# Patient Record
Sex: Female | Born: 1985 | Race: White | Hispanic: No | Marital: Married | Smoking: Never smoker
Health system: Southern US, Community
[De-identification: ages and names within clinical notes are randomized; demographics above are authoritative.]

## PROBLEM LIST (undated history)

## (undated) DIAGNOSIS — J45909 Unspecified asthma, uncomplicated: Secondary | ICD-10-CM

## (undated) HISTORY — PX: NO PAST SURGERIES: SHX2092

## (undated) HISTORY — PX: BASAL CELL CARCINOMA EXCISION: SHX1214

## (undated) HISTORY — PX: WISDOM TOOTH EXTRACTION: SHX21

---
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
## 2009-11-24 ENCOUNTER — Encounter: Admission: RE | Admit: 2009-11-24 | Discharge: 2009-11-24 | Payer: Self-pay | Admitting: Obstetrics and Gynecology

## 2010-06-18 ENCOUNTER — Encounter: Admission: RE | Admit: 2010-06-18 | Discharge: 2010-06-18 | Payer: Self-pay | Admitting: Obstetrics and Gynecology

## 2014-05-29 LAB — OB RESULTS CONSOLE RPR: RPR: NONREACTIVE

## 2014-05-29 LAB — OB RESULTS CONSOLE HEPATITIS B SURFACE ANTIGEN: Hepatitis B Surface Ag: NEGATIVE

## 2014-05-29 LAB — OB RESULTS CONSOLE ABO/RH: RH TYPE: POSITIVE

## 2014-05-29 LAB — OB RESULTS CONSOLE RUBELLA ANTIBODY, IGM: Rubella: IMMUNE

## 2014-05-29 LAB — OB RESULTS CONSOLE ANTIBODY SCREEN: Antibody Screen: NEGATIVE

## 2014-05-29 LAB — OB RESULTS CONSOLE HIV ANTIBODY (ROUTINE TESTING): HIV: NONREACTIVE

## 2014-10-24 NOTE — L&D Delivery Note (Signed)
Operative Delivery Note At 9:19 PM a viable female was delivered via Vaginal, Vacuum Engineer, manufacturing systems).  Presentation: vertex; Position: Left,, Occiput,, Posterior;  Verbal consent: obtained from patient.  Risks and benefits discussed in detail.  Risks include, but are not limited to the risks of anesthesia, bleeding, infection, damage to maternal tissues, fetal cephalhematoma.  There is also the risk of inability to effect vaginal delivery of the head, or shoulder dystocia that cannot be resolved by established maneuvers, leading to the need for emergency cesarean section.  Foley catheter placed prior to attempting vacuum extraction.  Leopolds done to assess fetal weight, position.  Pelvis assessed.  Op presentation noted.  Kiwi placed on posterior occiput.  I confirmed that the vacuum was not attached to the cervix or vagina prior to each pull.  The suction was released during pulls but the cup was not removed.  ~ 3 pulls noted.  Deep right lateral vaginal side wall laceration.  Actively bleeding.  Interrupted sutures placed for active bleeding.  Difficult visualization.  Retractors placed to reach the apex. Vagina avulsed from introitus at midline, no active bleeding.  Labial tears after repair was complete but no bleeding noted so they were not repaired. Perineum observed for several minutes to watch for bleeding, none noted. Uterus was firm with Pitocin bolus throughout repair.  Pt was very comfortable.  APGAR: 7, 9; weight 8 lb 3 oz (3715 g).   Placenta status: Intact, Spontaneous.   Cord: 3 vessels with the following complications: None.  Cord pH: n/a  Anesthesia: Epidural  Instruments: Deever retractors Episiotomy: None Lacerations: 3rd degree;Vaginal;Sulcus Suture Repair: Chromic-0.0, 2.0, 3.0, Vicryl 0.0 Est. Blood Loss (mL): 500 Assistant:  Farrel Gordon, CNM  Mom to postpartum.  Baby to Couplet care / Skin to Skin.  Thurnell Lose 12/21/2014, 10:28 PM

## 2014-12-03 LAB — OB RESULTS CONSOLE GBS: GBS: NEGATIVE

## 2014-12-19 ENCOUNTER — Encounter (HOSPITAL_COMMUNITY): Payer: Self-pay | Admitting: *Deleted

## 2014-12-19 ENCOUNTER — Inpatient Hospital Stay (HOSPITAL_COMMUNITY)
Admission: AD | Admit: 2014-12-19 | Discharge: 2014-12-23 | DRG: 775 | Disposition: A | Discharge status: Home / Self Care | Payer: BC Managed Care – PPO | Source: Ambulatory Visit | Attending: Obstetrics and Gynecology | Admitting: Obstetrics and Gynecology

## 2014-12-19 DIAGNOSIS — O4290 Premature rupture of membranes, unspecified as to length of time between rupture and onset of labor, unspecified weeks of gestation: Secondary | ICD-10-CM | POA: Diagnosis present

## 2014-12-19 DIAGNOSIS — Z3A39 39 weeks gestation of pregnancy: Secondary | ICD-10-CM | POA: Diagnosis present

## 2014-12-19 HISTORY — DX: Unspecified asthma, uncomplicated: J45.909

## 2014-12-19 LAB — WET PREP, GENITAL
CLUE CELLS WET PREP: NONE SEEN
TRICH WET PREP: NONE SEEN
Yeast Wet Prep HPF POC: NONE SEEN

## 2014-12-19 LAB — URINE MICROSCOPIC-ADD ON

## 2014-12-19 LAB — URINALYSIS, ROUTINE W REFLEX MICROSCOPIC
Bilirubin Urine: NEGATIVE
Glucose, UA: NEGATIVE mg/dL
Ketones, ur: NEGATIVE mg/dL
Leukocytes, UA: NEGATIVE
Nitrite: NEGATIVE
Protein, ur: NEGATIVE mg/dL
Specific Gravity, Urine: >1.03 — ABNORMAL HIGH (ref 1.005–1.030)
Urobilinogen, UA: 0.2 mg/dL (ref 0.0–1.0)
pH: 6 (ref 5.0–8.0)

## 2014-12-19 LAB — AMNISURE RUPTURE OF MEMBRANE (ROM) NOT AT ARMC: Amnisure ROM: POSITIVE

## 2014-12-19 NOTE — MAU Provider Note (Signed)
Meagan Torres is a 29 y.o. G1P0 at 39.4 weeks present to MAU c/o lof at 1220 today then again at 1600 clear.  She denies pain, vb or ctx w/+FM   History     There are no active problems to display for this patient.   Chief Complaint  Patient presents with  . Rupture of Membranes   HPI  OB History    Gravida Para Term Preterm AB TAB SAB Ectopic Multiple Living   1               Past Medical History  Diagnosis Date  . Asthma     Past Surgical History  Procedure Laterality Date  . Wisdom tooth extraction      History reviewed. No pertinent family history.  History  Substance Use Topics  . Smoking status: Never Smoker   . Smokeless tobacco: Never Used  . Alcohol Use: Yes     Comment: occas before pregnancy    Allergies: No Known Allergies  Prescriptions prior to admission  Medication Sig Dispense Refill Last Dose  . OVER THE COUNTER MEDICATION Primrose oil   12/18/2014 at Unknown time  . Prenatal Vit-Fe Fumarate-FA (PRENATAL MULTIVITAMIN) TABS tablet Take 1 tablet by mouth daily at 12 noon.   12/18/2014 at Unknown time    ROS See HPI above, all other systems are negative  Physical Exam   Blood pressure 131/84, pulse 90, temperature 98.2 F (36.8 C), resp. rate 18, height 5\' 1"  (1.549 m), weight 154 lb 9.6 oz (70.126 kg).  Physical Exam Ext:  WNL ABD: Soft, non tender to palpation, no rebound or guarding SVE: C/T/H   ED Course  Assessment: IUP at  39.4weeks Membranes: questionable FHR: Category 1 CTX:  q5-6   Plan:  amisure wetprep   Caeson Filippi, CNM, MSN 12/19/2014. 11:10 PM

## 2014-12-19 NOTE — MAU Provider Note (Signed)
MAU Addendum Note  Results for orders placed or performed during the hospital encounter of 12/19/14 (from the past 24 hour(s))  Urinalysis, Routine w reflex microscopic     Status: Abnormal   Collection Time: 12/19/14  9:25 PM  Result Value Ref Range   Color, Urine YELLOW YELLOW   APPearance CLEAR CLEAR   Specific Gravity, Urine >1.030 (H) 1.005 - 1.030   pH 6.0 5.0 - 8.0   Glucose, UA NEGATIVE NEGATIVE mg/dL   Hgb urine dipstick SMALL (A) NEGATIVE   Bilirubin Urine NEGATIVE NEGATIVE   Ketones, ur NEGATIVE NEGATIVE mg/dL   Protein, ur NEGATIVE NEGATIVE mg/dL   Urobilinogen, UA 0.2 0.0 - 1.0 mg/dL   Nitrite NEGATIVE NEGATIVE   Leukocytes, UA NEGATIVE NEGATIVE  Urine microscopic-add on     Status: Abnormal   Collection Time: 12/19/14  9:25 PM  Result Value Ref Range   Squamous Epithelial / LPF FEW (A) RARE   WBC, UA 0-2 <3 WBC/hpf   RBC / HPF 7-10 <3 RBC/hpf   Bacteria, UA FEW (A) RARE  Amnisure rupture of membrane (rom)     Status: None   Collection Time: 12/19/14 11:05 PM  Result Value Ref Range   Amnisure ROM POSITIVE   Wet prep, genital     Status: Abnormal   Collection Time: 12/19/14 11:05 PM  Result Value Ref Range   Yeast Wet Prep HPF POC NONE SEEN NONE SEEN   Trich, Wet Prep NONE SEEN NONE SEEN   Clue Cells Wet Prep HPF POC NONE SEEN NONE SEEN   WBC, Wet Prep HPF POC FEW (A) NONE SEEN     Plan: Admit to L&D for active management   Meagan Torres, CNM, MSN 12/19/2014. 11:39 PM

## 2014-12-19 NOTE — H&P (Signed)
Meagan Torres is a 29 y.o. female, G1 P0 at 39.4 weeks.  Pt desires a no intervention waterbirth.  She is softly declining a IV and does not want pitocin unless absolutely necessary.  We reviewed the potential for infection with prolong rupture.  Pt is aware and understands the risk.  We review if pitocin is started with will NOT be able to have her planned water birth and why.  She expressed understanding.  Consulted with Dr Simona Huh and she has agreed with a category 1 strip for no intervention allowing labor to start on it own. Pt has agreed to intermitting monitoring.  Patient Active Problem List   Diagnosis Date Noted  . PROM (premature rupture of membranes) 12/20/2014    Pregnancy Course: Patient entered care at 24.1 weeks. txr from [facility]  [facility] of 12/22/14 was established by LMP.      [place] evaluations:      24.2 weeks - Anatomy:  single. vertex. anterior placenta (placenta edge is 7.3 cm from internal os - normal) Cx  closed. Fluid WNL. Vertical pocket 5.3 cm. Female gender. Ovaries and adenexas are WNL  weeks - FU:     Significant prenatal events:   none   Last evaluation:   39 weeks  Reason for admission:  PROM  Pt States:   Contractions Frequency: none         Contraction severity: n/a         Fetal activity: +FM  OB History    Gravida Para Term Preterm AB TAB SAB Ectopic Multiple Living   1              Past Medical History  Diagnosis Date  . Asthma    Past Surgical History  Procedure Laterality Date  . Wisdom tooth extraction    . No past surgeries     Family History: family history is not on file. Social History:  reports that she has never smoked. She has never used smokeless tobacco. She reports that she drinks alcohol. She reports that she does not use illicit drugs.   Prenatal Transfer Tool  Maternal Diabetes: No Genetic Screening: Normal Maternal Ultrasounds/Referrals: Normal Fetal Ultrasounds or other Referrals:  None Maternal Substance Abuse:   No Significant Maternal Medications:  None Significant Maternal Lab Results: Lab values include: Group B Strep negative   ROS:  See HPI above, all other systems are negative  No Known Allergies  Dilation: Closed Effacement (%): Thick Exam by:: Kanitra Purifoy CNM Blood pressure 145/89, pulse 95, temperature 98.8 F (37.1 C), temperature source Oral, resp. rate 16, height 5\' 1"  (1.549 m), weight 154 lb (69.854 kg).  Maternal Exam:  Uterine Assessment: Contraction frequency is rare.  Abdomen: Gravid, non tender. Fundal height is aga.  Normal external genitalia, vulva, cervix, uterus and adnexa.  No lesions noted on exam.  Pelvis adequate for delivery.  Fetal presentation: Vertex by bedside US  Fetal Exam:  Monitor Surveillance : Continuous Monitoring   Mode: Ultrasound.  NICHD: Category 1 CTXs: Q 3-4 minutes EFW   7 lbs  Physical Exam: Nursing note and vitals reviewed General: alert and cooperative She appears well nourished Psychiatric: Normal mood and affect. Her behavior is normal Head: Normocephalic Eyes: Pupils are equal, round, and reactive to light Neck: Normal range of motion Cardiovascular: RRR without murmur  Respiratory: CTAB. Effort normal  Abd: soft, non-tender, +BS, no rebound, no guarding  Genitourinary: Vagina normal  Neurological: A&Ox3 Skin: Warm and dry  Musculoskeletal: Normal range  of motion  Homan's sign negative bilaterally No evidence of DVTs.  Edema: 1+ edema DTR: 2+ Clonus: None   Prenatal labs: ABO, Rh: --/--/O POS (02/27 0042) Antibody: NEG (02/27 0039) Rubella:    RPR: Nonreactive (08/06 0000)  HBsAg: Negative (08/06 0000)  HIV: Non-reactive (08/06 0000)  GBS: Negative (02/10 0000) Sickle cell/Hgb electrophoresis:  WNL Pap:  Neg 09/03/15 GC:    Chlamydia:  Genetic screenings:  wnl Glucola:  wnl  Assessment:  IUP at 39.5 weeks NICHD: Category 1 Membranes: SROM x 11hrs Bishop Score: 0 GBS negative   Plan:  Admit to  L&D for expectant/active management of labor. Possible augmentation options reviewed including foley bulb, AROM and/or pitocin.  posslibe risk our of a water birth with pitocin  IV pain medication per orders PRN Epidural per patient request Foley cath after patient is comfortable with epidural Anticipate SVD  Labor mgmt as ordered Desires water birth   Okay to ambulate around unit with wireless monitors  Okay to get up and shower without monitoring   May auscultate FHR intermittently,  if expectant management     q 30 min in active labor     q 15 min in transition     q 5 min with pushing.     May ambulate without monitoring.     If no active labor, may do NST q 2 hours.    [place] labs   Attending MD available at all times.  Eleina Jergens, CNM, MSN 12/20/2014, 3:50 AM       All information will be confirmed upon admisson

## 2014-12-19 NOTE — MAU Note (Signed)
Leaked fld at 1220 today. More leaking after 1600. Clear fld. No ctxs that i know of

## 2014-12-20 ENCOUNTER — Encounter (HOSPITAL_COMMUNITY): Payer: Self-pay | Admitting: *Deleted

## 2014-12-20 DIAGNOSIS — Z3403 Encounter for supervision of normal first pregnancy, third trimester: Secondary | ICD-10-CM | POA: Diagnosis present

## 2014-12-20 DIAGNOSIS — O4290 Premature rupture of membranes, unspecified as to length of time between rupture and onset of labor, unspecified weeks of gestation: Secondary | ICD-10-CM | POA: Diagnosis present

## 2014-12-20 DIAGNOSIS — Z3A39 39 weeks gestation of pregnancy: Secondary | ICD-10-CM | POA: Diagnosis present

## 2014-12-20 LAB — CBC
HEMATOCRIT: 37.8 % (ref 36.0–46.0)
HEMOGLOBIN: 13 g/dL (ref 12.0–15.0)
MCH: 31.9 pg (ref 26.0–34.0)
MCHC: 34.4 g/dL (ref 30.0–36.0)
MCV: 92.9 fL (ref 78.0–100.0)
PLATELETS: 250 10*3/uL (ref 150–400)
RBC: 4.07 MIL/uL (ref 3.87–5.11)
RDW: 13.1 % (ref 11.5–15.5)
WBC: 16.4 10*3/uL — AB (ref 4.0–10.5)

## 2014-12-20 LAB — COMPREHENSIVE METABOLIC PANEL
ALBUMIN: 2.9 g/dL — AB (ref 3.5–5.2)
ALK PHOS: 182 U/L — AB (ref 39–117)
ALT: 18 U/L (ref 0–35)
ANION GAP: 6 (ref 5–15)
AST: 23 U/L (ref 0–37)
BILIRUBIN TOTAL: 0.4 mg/dL (ref 0.3–1.2)
BUN: 10 mg/dL (ref 6–23)
CALCIUM: 8.8 mg/dL (ref 8.4–10.5)
CO2: 22 mmol/L (ref 19–32)
CREATININE: 0.56 mg/dL (ref 0.50–1.10)
Chloride: 106 mmol/L (ref 96–112)
GFR calc Af Amer: >90 mL/min (ref >90)
GFR calc non Af Amer: >90 mL/min (ref >90)
Glucose, Bld: 97 mg/dL (ref 70–99)
Potassium: 3.5 mmol/L (ref 3.5–5.1)
Sodium: 134 mmol/L — ABNORMAL LOW (ref 135–145)
TOTAL PROTEIN: 6.1 g/dL (ref 6.0–8.3)

## 2014-12-20 LAB — TYPE AND SCREEN
ABO/RH(D): O POS
Antibody Screen: NEGATIVE

## 2014-12-20 LAB — LACTATE DEHYDROGENASE: LDH: 140 U/L (ref 94–250)

## 2014-12-20 LAB — URIC ACID: Uric Acid, Serum: 3.7 mg/dL (ref 2.4–7.0)

## 2014-12-20 LAB — PROTEIN / CREATININE RATIO, URINE
CREATININE, URINE: 124 mg/dL
PROTEIN CREATININE RATIO: 0.09 (ref 0.00–0.15)
Total Protein, Urine: 11 mg/dL

## 2014-12-20 LAB — ABO/RH: ABO/RH(D): O POS

## 2014-12-20 MED ORDER — SODIUM CHLORIDE 0.9 % IV SOLN: 250.0000 mL | INTRAVENOUS | Status: DC | PRN

## 2014-12-20 MED ORDER — OXYTOCIN 40 UNITS IN LACTATED RINGERS INFUSION - SIMPLE MED
62.5000 mL/h | INTRAVENOUS | Status: DC
  Filled 2014-12-20: qty 1000

## 2014-12-20 MED ORDER — CITRIC ACID-SODIUM CITRATE 334-500 MG/5ML PO SOLN: 30.0000 mL | ORAL | Status: DC | PRN

## 2014-12-20 MED ORDER — ACETAMINOPHEN 325 MG PO TABS: 650.0000 mg | ORAL_TABLET | ORAL | Status: DC | PRN

## 2014-12-20 MED ORDER — LIDOCAINE HCL (PF) 1 % IJ SOLN
30.0000 mL | INTRAMUSCULAR | Status: DC | PRN
  Filled 2014-12-20: qty 30

## 2014-12-20 MED ORDER — ONDANSETRON HCL 4 MG/2ML IJ SOLN: 4.0000 mg | Freq: Four times a day (QID) | INTRAMUSCULAR | Status: DC | PRN

## 2014-12-20 MED ORDER — LACTATED RINGERS IV SOLN
500.0000 mL | INTRAVENOUS | Status: DC | PRN
  Administered 2014-12-21: 500 mL via INTRAVENOUS

## 2014-12-20 MED ORDER — OXYTOCIN BOLUS FROM INFUSION
500.0000 mL | INTRAVENOUS | Status: DC
  Administered 2014-12-21: 500 mL via INTRAVENOUS

## 2014-12-20 MED ORDER — SODIUM CHLORIDE 0.9 % IJ SOLN: 3.0000 mL | INTRAMUSCULAR | Status: DC | PRN

## 2014-12-20 MED ORDER — OXYCODONE-ACETAMINOPHEN 5-325 MG PO TABS: 2.0000 | ORAL_TABLET | ORAL | Status: DC | PRN

## 2014-12-20 MED ORDER — MISOPROSTOL 200 MCG PO TABS: 50.0000 ug | ORAL_TABLET | ORAL | Status: DC | PRN

## 2014-12-20 MED ORDER — LACTATED RINGERS IV SOLN
INTRAVENOUS | Status: DC
  Administered 2014-12-21 (×4): via INTRAVENOUS

## 2014-12-20 MED ORDER — SODIUM CHLORIDE 0.9 % IJ SOLN
3.0000 mL | Freq: Two times a day (BID) | INTRAMUSCULAR | Status: DC
Start: 2014-12-20 — End: 2014-12-22

## 2014-12-20 MED ORDER — TERBUTALINE SULFATE 1 MG/ML IJ SOLN: 0.2500 mg | Freq: Once | INTRAMUSCULAR | Status: AC | PRN

## 2014-12-20 MED ORDER — OXYTOCIN 40 UNITS IN LACTATED RINGERS INFUSION - SIMPLE MED: 1.0000 m[IU]/min | INTRAVENOUS | Status: DC

## 2014-12-20 NOTE — Progress Notes (Signed)
Pt had intended to discuss with midwife starting pitocin at 1530. She is now feeling the contractions are stronger and more regular, so desires to wait. Voices understanding of risks of waiting with prolonged rupture of membranes.

## 2014-12-20 NOTE — Progress Notes (Signed)
Colena Ketterman MRN: [id number]  Subjective: -Patient reports increase in contraction intensity.  Reports onset of back and hip pain.  Reports good fetal movement, some vaginal bleeding, and continuous LOF.    Objective: BP 125/83 mmHg  Pulse 94  Temp(Src) 97.8 F (36.6 C) (Axillary)  Resp 20  Ht 5\' 1"  (1.549 m)  Wt 154 lb (69.854 kg)  BMI 29.11 kg/m2     FHT: 135 bpm, Mod Var, -Decels, +Accels UC: Q5-71min, palpates mild to moderate   SVE: Deferred Membranes:SROM x 31 hours Pitocin:None  Assessment:  IUP at 39.5 weeks Cat I FT  Prolonged ROM  Plan: -Expectant Management -Continue other mgmt as ordered  Felise Georgia LYNN,MSN, CNM 12/20/2014, 6:57 PM

## 2014-12-20 NOTE — Plan of Care (Signed)
Problem: Consults Goal: Birthing Suites Patient Information Press F2 to bring up selections list Outcome: Completed/Met Date Met:  12/20/14  Pt 37-[redacted] weeks EGA

## 2014-12-20 NOTE — Progress Notes (Signed)
Meagan Torres MRN: [id number]  Subjective: -Patient with husband and family at bedside.  S/P ambulation.  Reports contractions are intensifying, but that they remain infrequent.    Objective: BP 147/86 mmHg  Pulse 99  Temp(Src) 98.5 F (36.9 C) (Oral)  Resp 18  Ht 5\' 1"  (1.549 m)  Wt 154 lb (69.854 kg)  BMI 29.11 kg/m2     FHT: 155 bpm, Mod Var, -Decels, +Accels UC: Occasional graphed   SVE:   Dilation: 2 Effacement (%): 70 Station: -3 Exam by:: Avram Danielson Membranes: SROM x 24 hours Pitocin: None  Assessment:  IUP at 39.5 weeks Cat I FT  Prolonged ROM GBS Negative  Plan: -Discussed options for labor augmentation to include PO cytotec or IV pitocin -Patient informed of need to have IV access in each situation -Patient appears unsure of next step regarding POC and requests time to discuss with husband and family -Dr. Florene Route updated on patient status  -Continue other mgmt as ordered  [facility], Davyn Morandi LYNN,MSN, CNM 12/20/2014, 11:57 AM   Addendum 1245: Dr. Florene Route and I in room to discuss POC with patient and husband R/B of augmentation discussed, questions and concerns addressed Patient initially opting for pitocin augmentation, but then admits that she does not desire any interventions Dr. Florene Route discussed risks and benefits of no augmentation including infection, fetal distress, and prolonged labor Patient understands that fetal distress and/or infection is a contradiction to waterbirth Patient opts to continue expectant management with understanding that this is against recommendation of provider and attending physician Will continue to monitor as appropriate  Ena Demary, [place] MSN, CNM 1:41 PM

## 2014-12-20 NOTE — Progress Notes (Signed)
Labor Progress  Subjective: sleeping  Objective: BP 110/65 mmHg  Pulse 93  Temp(Src) 98 F (36.7 C) (Oral)  Resp 16  Ht 5\' 1"  (1.549 m)  Wt 154 lb (69.854 kg)  BMI 29.11 kg/m2     FHT: 140 moderate variability, + accel, no decel CTX:  irregular, every 3-8 minutes Uterus gravid, soft non tender SVE:  Dilation: 2.5 Effacement (%): 50 Station: -3 Exam by:: [name] cnm   Assessment:  IUP at 39.5 weeks NICHD: Category 1 Membranes:  SROM x 18hrs, no s/s of infection Labor progress: Inadquate labor GBS: negative   Plan: Continue labor plan intermittent monitoring Rest/Ambulate Frequent position changes to facilitate fetal rotation and descent.      Meagan Torres, CNM, MSN 12/20/2014. 6:28 AM

## 2014-12-20 NOTE — Progress Notes (Signed)
Meagan Torres MRN: [id number]  Subjective: -Care assumed of 29y.o. G1P0 at 39.5wks who presented for SROM.  Patient ambulating in hall.  In room to discuss POC as Prolonged ROM will be deemed at 1200.  Patient reports good fetal movement, no vaginal bleeding, continuous leaking of fluid, and contractions q7-63minutes.  Patient unsure of POC for augmentation processes and continues to decline IV access.  Objective: BP 139/93 mmHg  Pulse 98  Temp(Src) 98 F (36.7 C) (Oral)  Resp 16  Ht 5\' 1"  (1.549 m)  Wt 154 lb (69.854 kg)  BMI 29.11 kg/m2     FHT: 145 by doppler at 0745 UC:   None palpated SVE:  Deferred Membranes:SROM at 12pm on 2/26 Pitocin: None  Assessment:  IUP at 39.5wks SROM >18hrs Afebrile GBS Negative Desires Low Intervention Labor and Delivery  Plan: -Discussed pitocin augmentation including contradiction to waterbirth -Discussed oral cytotec dosing, but need for IV access with this method -Patient and husband express optimism regarding progression without pharmacologic interventions -Requests membrane stripping, discussed SROM and how membranes no longer intact -Discussed risk of infection r/t prolonged ROM -Dr. Florene Route updated on patient status, will be in to speak with patient -Continue other mgmt as ordered  [facility], Abigayle Wilinski LYNN,MSN, CNM 12/20/2014, 9:05 AM

## 2014-12-20 NOTE — Progress Notes (Signed)
Meagan Torres MRN: [id number]  Subjective: -Patient reports increase in contraction intensity.  Starting to have shakes, requests vaginal exam.   Objective: BP 137/91 mmHg  Pulse 87  Temp(Src) 98.2 F (36.8 C) (Oral)  Resp 18  Ht 5\' 1"  (1.549 m)  Wt 154 lb (69.854 kg)  BMI 29.11 kg/m2     FHT: 145 bpm, Mod Var, -Decels, +Accels UC:  Q3-60min, palpates mild to moderae  SVE:   Dilation: 2.5 Effacement (%): 90 Station: -2 Exam by:: Meagan Torres cnm Membranes: SROM x 34hours Pitocin: None  Assessment:  IUP at 39.5wks Cat I FT  Prolonged ROM Afebrile Early Labor  Plan: -Patient desires expectant management -Dr. Florene Route updated on patient status -Continue other mgmt as ordered  [facility], Meagan Morua LYNN,MSN, CNM 12/20/2014, 10:19 PM

## 2014-12-20 NOTE — Progress Notes (Signed)
In to introduce myself to patient. Updated that pt went from closed on admission to 2-3 overnight.  Afebrile, contracting but not feeling discomfort. Pt refused saline lock. I asked pt if she understood the risk of not having an IV.  Pt stated it was needed in the event of an emergency. I explained to pt that emergent c-section could be delayed while attempting to IV acces.  Also in the event of hemorrhage, medications would be needed to stop bleeding.  If delayed, hemorrhage could occur requiring a blood transfusion.  Pt verbalized understanding but inquired as to if she could change her mind. FOB and sister-in-law present.

## 2014-12-20 NOTE — Progress Notes (Signed)
In to discuss plan.  Pitocin has been recommended due to protracted labor, prolonged ROM several times by CNM and refused.  Initially it was to be started at 12 noon, 24 hours ruptured, but pt changed her mind.  Pt made cervical change from closed to 2-3 cm but contractions are spacing out.  With CNM and RN present, we discussed risks and benefits of augmentation with pt an  FOB.  We all  agree to start Pitocin if chorioamnionitis or fetal distress.  Pt understands these problems would also put her at risk for cesarean section and it may be too late to intervene.  Although husband considered starting Pitocin at 2:30 pm, pt was not comfortable with that.  When asked if she were still 2 cm in 4 days, hypothetically, pt stated she would still not want to start Pitocin.  I feel pt and husband have been counseled extensively and thoroughly regarding the current diagnosis, treatment options, risks and benefits.  They accept risks and desire to have more time to talk.  Time spent with face to face counseling was ~20-30 minutes.

## 2014-12-21 ENCOUNTER — Inpatient Hospital Stay (HOSPITAL_COMMUNITY): Payer: BC Managed Care – PPO | Admitting: Anesthesiology

## 2014-12-21 LAB — CBC
HCT: 38.5 % (ref 36.0–46.0)
HCT: 33.5 % — ABNORMAL LOW (ref 36.0–46.0)
HEMOGLOBIN: 11.6 g/dL — AB (ref 12.0–15.0)
Hemoglobin: 13.4 g/dL (ref 12.0–15.0)
MCH: 32.1 pg (ref 26.0–34.0)
MCH: 32.1 pg (ref 26.0–34.0)
MCHC: 34.8 g/dL (ref 30.0–36.0)
MCHC: 34.6 g/dL (ref 30.0–36.0)
MCV: 92.1 fL (ref 78.0–100.0)
MCV: 92.8 fL (ref 78.0–100.0)
PLATELETS: 233 10*3/uL (ref 150–400)
Platelets: 264 10*3/uL (ref 150–400)
RBC: 4.18 MIL/uL (ref 3.87–5.11)
RBC: 3.61 MIL/uL — AB (ref 3.87–5.11)
RDW: 13 % (ref 11.5–15.5)
RDW: 13 % (ref 11.5–15.5)
WBC: 20.3 10*3/uL — ABNORMAL HIGH (ref 4.0–10.5)
WBC: 34.6 10*3/uL — ABNORMAL HIGH (ref 4.0–10.5)

## 2014-12-21 LAB — HIV ANTIBODY (ROUTINE TESTING W REFLEX): HIV Screen 4th Generation wRfx: NONREACTIVE

## 2014-12-21 LAB — RPR: RPR: NONREACTIVE

## 2014-12-21 MED ORDER — PHENYLEPHRINE 40 MCG/ML (10ML) SYRINGE FOR IV PUSH (FOR BLOOD PRESSURE SUPPORT)
80.0000 ug | PREFILLED_SYRINGE | INTRAVENOUS | Status: DC | PRN
  Filled 2014-12-21: qty 2
  Filled 2014-12-21: qty 20

## 2014-12-21 MED ORDER — BUPIVACAINE HCL (PF) 0.5 % IJ SOLN
INTRAMUSCULAR | Status: DC | PRN
  Administered 2014-12-21: 6 mL via EPIDURAL

## 2014-12-21 MED ORDER — PHENYLEPHRINE 40 MCG/ML (10ML) SYRINGE FOR IV PUSH (FOR BLOOD PRESSURE SUPPORT)
80.0000 ug | PREFILLED_SYRINGE | INTRAVENOUS | Status: DC | PRN
  Filled 2014-12-21: qty 20
  Filled 2014-12-21: qty 2

## 2014-12-21 MED ORDER — FENTANYL 2.5 MCG/ML BUPIVACAINE 1/10 % EPIDURAL INFUSION (WH - ANES)
14.0000 mL/h | INTRAMUSCULAR | Status: DC | PRN
Start: 2014-12-21 — End: 2014-12-22
  Administered 2014-12-21 (×2): 14 mL/h via EPIDURAL
  Filled 2014-12-21 (×3): qty 125

## 2014-12-21 MED ORDER — DIPHENHYDRAMINE HCL 50 MG/ML IJ SOLN: 12.5000 mg | INTRAMUSCULAR | Status: DC | PRN

## 2014-12-21 MED ORDER — FENTANYL 2.5 MCG/ML BUPIVACAINE 1/10 % EPIDURAL INFUSION (WH - ANES)
INTRAMUSCULAR | Status: DC | PRN
  Administered 2014-12-21: 12 mL/h via EPIDURAL

## 2014-12-21 MED ORDER — IBUPROFEN 600 MG PO TABS
600.0000 mg | ORAL_TABLET | Freq: Four times a day (QID) | ORAL | Status: DC
  Administered 2014-12-21 – 2014-12-23 (×7): 600 mg via ORAL
  Filled 2014-12-21 (×7): qty 1

## 2014-12-21 MED ORDER — MISOPROSTOL 200 MCG PO TABS
ORAL_TABLET | ORAL | Status: AC
  Filled 2014-12-21: qty 4

## 2014-12-21 MED ORDER — EPHEDRINE 5 MG/ML INJ
10.0000 mg | INTRAVENOUS | Status: DC | PRN
  Filled 2014-12-21: qty 2

## 2014-12-21 MED ORDER — LIDOCAINE-EPINEPHRINE (PF) 1.5 %-1:200000 IJ SOLN
INTRAMUSCULAR | Status: DC | PRN
  Administered 2014-12-21: 3 mL

## 2014-12-21 MED ORDER — BUPIVACAINE HCL (PF) 0.25 % IJ SOLN
INTRAMUSCULAR | Status: DC | PRN
  Administered 2014-12-21: 4 mL via EPIDURAL
  Administered 2014-12-21: 4 mL
  Administered 2014-12-21: 10 mL via EPIDURAL

## 2014-12-21 MED ORDER — LACTATED RINGERS IV SOLN
500.0000 mL | Freq: Once | INTRAVENOUS | Status: AC
  Administered 2014-12-21: 500 mL via INTRAVENOUS

## 2014-12-21 NOTE — Progress Notes (Signed)
Meagan Torres MRN: [id number]  Subjective: - Patient reports contractions have increased in intensity. Patient vomiting and stating "I can't do this." Husband and family supportive.  Objective: BP 137/91 mmHg  Pulse 87  Temp(Src) 98.2 F (36.8 C) (Oral)  Resp 18  Ht 5\' 1"  (1.549 m)  Wt 154 lb (69.854 kg)  BMI 29.11 kg/m2     FHT: 135 by doppler UC:   Palpates moderate to strong SVE:   Dilation: 4.5 Effacement (%): 90 Station: -2 Exam by:: Hubbert Landrigan cnm Membranes: SROM x 36hrs Pitocin: None  Assessment:  IUP at 39.6wks Cat I FT  Prolonged ROM  Plan: -Patient appears to be responsive to positive feedback and encouragement -Coping techniques reinforced (i.e. Controlled breathing, water usage, goal orientation) -Waterbirth tub inflated  [name],MSN, CNM 12/21/2014, 12:23 AM

## 2014-12-21 NOTE — Progress Notes (Signed)
LE  Addendum Trial pushing x 2  Little urge to push, dense epidural  Pt allowed to labor down

## 2014-12-21 NOTE — Anesthesia Preprocedure Evaluation (Signed)
Anesthesia Evaluation  Patient identified by MRN, date of birth, ID band Patient awake    Reviewed: Allergy & Precautions, Patient's Chart, lab work & pertinent test results  History of Anesthesia Complications Negative for: history of anesthetic complications  Airway Mallampati: I  TM Distance: >3 FB Neck ROM: Full    Dental  (+) Teeth Intact   Pulmonary neg shortness of breath, asthma , neg sleep apnea, neg COPDneg recent URI,          Cardiovascular negative cardio ROS  Rhythm:Regular     Neuro/Psych negative neurological ROS  negative psych ROS   GI/Hepatic negative GI ROS, Neg liver ROS,   Endo/Other  negative endocrine ROS  Renal/GU negative Renal ROS     Musculoskeletal   Abdominal   Peds  Hematology negative hematology ROS (+)   Anesthesia Other Findings   Reproductive/Obstetrics (+) Pregnancy                             Anesthesia Physical Anesthesia Plan  ASA: II  Anesthesia Plan: Epidural   Post-op Pain Management:    Induction:   Airway Management Planned:   Additional Equipment:   Intra-op Plan:   Post-operative Plan:   Informed Consent: I have reviewed the patients History and Physical, chart, labs and discussed the procedure including the risks, benefits and alternatives for the proposed anesthesia with the patient or authorized representative who has indicated his/her understanding and acceptance.     Plan Discussed with: Anesthesiologist  Anesthesia Plan Comments:         Anesthesia Quick Evaluation

## 2014-12-21 NOTE — Progress Notes (Signed)
Meagan Torres MRN: [id number]  Subjective: -Nurse call reporting patient desires epidural.  In room to assess.  Patient laying in bed.  Moaning with contractions. Okay with provider performing cervical exam, but feels will not change desire for epidural.  Husband at bedside, supportive, but reports that "I can't see her like this."   Objective: BP 137/91 mmHg  Pulse 87  Temp(Src) 97.9 F (36.6 C) (Axillary)  Resp 18  Ht 5\' 1"  (1.549 m)  Wt 154 lb (69.854 kg)  BMI 29.11 kg/m2     FHT: 130  bpm, Mod Var, -Decels, +Accels UC:  Palpates moderate to strong SVE:   Dilation: 6 Effacement (%): 70 Station: -2 Exam by:: Glen Blatchley Membranes: SROM x 38hrs Pitocin: None  Assessment:  IUP at 39.6wks Reassuring FHT Prolonged SROM Desires Epidural  Plan: -Patient expresses strong desire for epidural for pain management -IV pain medications offered, patient declines -Cervical exam performed and patient continues to express desire for epidural despite dilation -Orders placed for epidural -IV started, Labs drawn -Continue other mgmt as ordered  [name],MSN, CNM 12/21/2014, 2:57 AM

## 2014-12-21 NOTE — Progress Notes (Signed)
Labor Progress  Subjective: Comfortable with bolus, Starting to get tired of pushing  Objective: BP 157/82 mmHg  Pulse 106  Temp(Src) 98.9 F (37.2 C) (Axillary)  Resp 20  Ht 5\' 1"  (1.549 m)  Wt 154 lb (69.854 kg)  BMI 29.11 kg/m2  SpO2 97%   Total I/O In: -  Out: 1100 [Urine:1100] FHT: 120, moderate variability, + accel, variable decel, prolong decel at 1715 x 8 monutes CTX:  regular, every 5-8 minutes Uterus gravid, soft non tender SVE:  Dilation: 10 Effacement (%): 70 Station: +1 Exam by:: Kandon Hosking   Assessment:  IUP at 39.6 weeks NICHD: Category 3 with active intrauterine resuscitative measures.  Category 2 since 1730 Membranes:  SROM  x 55hrs, no s/s of infection Labor progress: 2nd stage Asynclitic position  GBS: negative Pushing since 1620 Dr Simona Huh call to the bedside at 1718 for decel assessment  Plan: Continue labor plan Continuous monitoring Frequent position changes to facilitate fetal rotation and descent. Possible vacuum assistance         Neita Landrigan, CNM, MSN 12/21/2014. 6:42 PM

## 2014-12-21 NOTE — Progress Notes (Signed)
Labor Progress LE Subjective: Confortable, no complaints  Objective: BP 116/76 mmHg  Pulse 103  Temp(Src) 99.3 F (37.4 C) (Axillary)  Resp 20  Ht 5\' 1"  (1.549 m)  Wt 154 lb (69.854 kg)  BMI 29.11 kg/m2  SpO2 97%   Total I/O In: -  Out: 800 [Urine:800] FHT:145, moderate variability, + accel, no decel CTX:  regular, every 3-4 minutes Uterus gravid, soft non tender SVE:  C/C/+2  Assessment:  IUP at 39.3 weeks NICHD: Category 1 Membranes:  SROM x 22hrs, no s/s of infection Labor progress: transition Asynclitic position  GBS: negative   Plan: Continue labor plan Continuous monitoring Frequent position changes to facilitate fetal rotation and descent. Will reassess with cervical exam at 1300 or earlier if necessary       Ginny Loomer, CNM, MSN 12/21/2014. 2:53 PM

## 2014-12-21 NOTE — Anesthesia Procedure Notes (Signed)
Epidural Patient location during procedure: OB  Staffing Anesthesiologist: Kavion Mancinas, CHRIS Performed by: anesthesiologist   Preanesthetic Checklist Completed: patient identified, surgical consent, pre-op evaluation, timeout performed, IV checked, risks and benefits discussed and monitors and equipment checked  Epidural Patient position: sitting Prep: site prepped and draped and DuraPrep Patient monitoring: heart rate, cardiac monitor, continuous pulse ox and blood pressure Approach: midline Location: L4-L5 Injection technique: LOR saline  Needle:  Needle type: Tuohy  Needle gauge: 17 G Needle length: 9 cm Catheter type: closed end flexible Catheter size: 19 Gauge Catheter at skin depth: 12 cm Test dose: negative and 1.5% lidocaine with Epi 1:200 K  Assessment Events: blood not aspirated, injection not painful, no injection resistance, negative IV test and no paresthesia  Additional Notes H+P and labs checked, risks and benefits discussed with the patient, consent obtained, procedure tolerated well and without complications.  Reason for block:procedure for pain

## 2014-12-21 NOTE — Progress Notes (Signed)
Labor Progress LE Subjective: C/o urge to have a BM, spontaneous pushing, increased back pain  Objective: BP 116/76 mmHg  Pulse 103  Temp(Src) 99.3 F (37.4 C) (Axillary)  Resp 20  Ht 5\' 1"  (1.549 m)  Wt 154 lb (69.854 kg)  BMI 29.11 kg/m2  SpO2 97%   Total I/O In: -  Out: 800 [Urine:800] FHT: 155, moderate variability, + accel, occasional variable CTX:  regular, every 3-4 minutes Uterus gravid, soft non tender SVE:  Anterior lip swelling   Assessment:  IUP at 39.6 weeks NICHD: Category 2 Membranes:  SROM x 24hrs, no s/s of infection Labor progress: Inadquate progress with second stage Asynclitic position  GBS: negative Ineffective pushing   Plan: Continue labor plan Continuous monitoring Frequent position changes to facilitate fetal rotation and descent. Will reassess with cervical exam at 1400 or earlier if necessary Dr Simona Huh consulted Anesthesia call for bolus DC pushing, allow pt to labor down       Meagan Torres, CNM, MSN 12/21/2014. 2:59 PM

## 2014-12-21 NOTE — Progress Notes (Addendum)
Labor Progress LE Subjective: Comfortable with the epidural  Objective: BP 112/66 mmHg  Pulse 105  Temp(Src) 97.8 F (36.6 C) (Axillary)  Resp 18  Ht 5\' 1"  (1.549 m)  Wt 154 lb (69.854 kg)  BMI 29.11 kg/m2  SpO2 97%   Total I/O In: -  Out: 800 [Urine:800] FHT: 150, moderate variability, + accel, no decel CTX:  regular, every 3-5 minutes Uterus gravid, soft non tender SVE:  9/C/+1   Assessment:  IUP at 39.6 weeks NICHD: Category Membranes:  SROM x 43hrs, no s/s of infection Labor progress: transition Asynclitic position  Pitocin Augmentation GBS: negative   Plan: Continue labor plan Continuous monitoring Rest Frequent position changes to facilitate fetal rotation and descent. Will reassess with cervical exam at 1100 or earlier if necessary       Deshea Pooley, CNM, MSN 12/21/2014. 10:20 AM

## 2014-12-21 NOTE — Progress Notes (Signed)
Consulted to assess fetal tracing-deceleration to 90s.  Came to bedside to assess. Pt appears comfortable. Vulvar swelling.  C/C/+1-+2 with caput. Adequate pelvis.  Good descent with pushing.  EFM:  Good variability.  Variable decelerations with contractions.  Prolonged decel to 90s with slow return to baseline.  Good variability throughout.  Discussed with pt and husband risks and benefits of vacuum assisted delivery.  Other option if prolonged decel, fetal distress emergent cesarean section.  Will remain in house until baby delivered.

## 2014-12-21 NOTE — Progress Notes (Signed)
Venus Standard has remained in room throughout pushing

## 2014-12-22 ENCOUNTER — Encounter (HOSPITAL_COMMUNITY): Payer: Self-pay | Admitting: *Deleted

## 2014-12-22 LAB — CBC
HEMATOCRIT: 28.6 % — AB (ref 36.0–46.0)
HEMOGLOBIN: 10 g/dL — AB (ref 12.0–15.0)
MCH: 32.4 pg (ref 26.0–34.0)
MCHC: 35 g/dL (ref 30.0–36.0)
MCV: 92.6 fL (ref 78.0–100.0)
Platelets: 207 10*3/uL (ref 150–400)
RBC: 3.09 MIL/uL — AB (ref 3.87–5.11)
RDW: 13.1 % (ref 11.5–15.5)
WBC: 26.8 10*3/uL — ABNORMAL HIGH (ref 4.0–10.5)

## 2014-12-22 MED ORDER — SIMETHICONE 80 MG PO CHEW: 80.0000 mg | CHEWABLE_TABLET | ORAL | Status: DC | PRN

## 2014-12-22 MED ORDER — ACETAMINOPHEN-CODEINE #3 300-30 MG PO TABS
2.0000 | ORAL_TABLET | ORAL | Status: DC | PRN
  Administered 2014-12-22 – 2014-12-23 (×3): 2 via ORAL
  Filled 2014-12-22 (×2): qty 2

## 2014-12-22 MED ORDER — PRENATAL MULTIVITAMIN CH
1.0000 | ORAL_TABLET | Freq: Every day | ORAL | Status: DC
  Administered 2014-12-22 – 2014-12-23 (×2): 1 via ORAL
  Filled 2014-12-22 (×2): qty 1

## 2014-12-22 MED ORDER — LANOLIN HYDROUS EX OINT: TOPICAL_OINTMENT | CUTANEOUS | Status: DC | PRN

## 2014-12-22 MED ORDER — DIBUCAINE 1 % RE OINT: 1.0000 "application " | TOPICAL_OINTMENT | RECTAL | Status: DC | PRN

## 2014-12-22 MED ORDER — WITCH HAZEL-GLYCERIN EX PADS: 1.0000 "application " | MEDICATED_PAD | CUTANEOUS | Status: DC | PRN

## 2014-12-22 MED ORDER — FERROUS SULFATE 325 (65 FE) MG PO TABS
325.0000 mg | ORAL_TABLET | Freq: Two times a day (BID) | ORAL | Status: DC
  Administered 2014-12-22 – 2014-12-23 (×3): 325 mg via ORAL
  Filled 2014-12-22 (×3): qty 1

## 2014-12-22 MED ORDER — OXYCODONE-ACETAMINOPHEN 5-325 MG PO TABS
1.0000 | ORAL_TABLET | ORAL | Status: DC | PRN
  Administered 2014-12-22: 1 via ORAL
  Filled 2014-12-22: qty 1

## 2014-12-22 MED ORDER — ZOLPIDEM TARTRATE 5 MG PO TABS: 5.0000 mg | ORAL_TABLET | Freq: Every evening | ORAL | Status: DC | PRN

## 2014-12-22 MED ORDER — ACETAMINOPHEN-CODEINE #3 300-30 MG PO TABS: 1.0000 | ORAL_TABLET | Freq: Four times a day (QID) | ORAL | Status: DC | PRN

## 2014-12-22 MED ORDER — OXYCODONE-ACETAMINOPHEN 5-325 MG PO TABS: 2.0000 | ORAL_TABLET | ORAL | Status: DC | PRN

## 2014-12-22 MED ORDER — ACETAMINOPHEN-CODEINE #3 300-30 MG PO TABS
1.0000 | ORAL_TABLET | ORAL | Status: DC | PRN
  Administered 2014-12-22: 1 via ORAL
  Filled 2014-12-22: qty 1
  Filled 2014-12-22: qty 2

## 2014-12-22 MED ORDER — BENZOCAINE-MENTHOL 20-0.5 % EX AERO
1.0000 "application " | INHALATION_SPRAY | CUTANEOUS | Status: DC | PRN
  Administered 2014-12-22 – 2014-12-23 (×2): 1 via TOPICAL
  Filled 2014-12-22 (×2): qty 56

## 2014-12-22 MED ORDER — ONDANSETRON HCL 4 MG PO TABS: 4.0000 mg | ORAL_TABLET | ORAL | Status: DC | PRN

## 2014-12-22 MED ORDER — SENNOSIDES-DOCUSATE SODIUM 8.6-50 MG PO TABS
2.0000 | ORAL_TABLET | ORAL | Status: DC
  Administered 2014-12-22 (×2): 2 via ORAL
  Filled 2014-12-22 (×2): qty 2

## 2014-12-22 MED ORDER — ONDANSETRON HCL 4 MG/2ML IJ SOLN: 4.0000 mg | INTRAMUSCULAR | Status: DC | PRN

## 2014-12-22 MED ORDER — DIPHENHYDRAMINE HCL 25 MG PO CAPS: 25.0000 mg | ORAL_CAPSULE | Freq: Four times a day (QID) | ORAL | Status: DC | PRN

## 2014-12-22 MED ORDER — TETANUS-DIPHTH-ACELL PERTUSSIS 5-2.5-18.5 LF-MCG/0.5 IM SUSP: 0.5000 mL | Freq: Once | INTRAMUSCULAR | Status: DC

## 2014-12-22 NOTE — Anesthesia Postprocedure Evaluation (Signed)
  Anesthesia Post-op Note  Patient: Meagan Torres  Procedure(s) Performed: * No procedures listed *  Patient Location: Mother/Baby  Anesthesia Type:Epidural  Level of Consciousness: awake, alert  and oriented  Airway and Oxygen Therapy: Patient Spontanous Breathing  Post-op Pain: none  Post-op Assessment: Post-op Vital signs reviewed and Patient's Cardiovascular Status Stable  Post-op Vital Signs: Reviewed and stable  Last Vitals:  Filed Vitals:   12/22/14 0515  BP: 109/70  Pulse: 108  Temp: 36.7 C  Resp: 16    Complications: No apparent anesthesia complications

## 2014-12-22 NOTE — Lactation Note (Signed)
This note was copied from the chart of [place]. Lactation Consultation Note New mom w/small short shaft nipples. Hand expression taught w/noted easy flow colostrum. attempted latch to breast, unable to compress areola and nipple for a deep latch. Fitted K/#38 NS, taught application and care. Assisted in latch, baby sleepy. Encouraged STS. Noted colostrum expressed in NS. Gave #20 NS for when and if nipples get larger. Gave shells to assist in everting nipples. Hand pump given to evert nipples and stimulate breast.  Mom encouraged to feed baby 8-12 times/24 hours and with feeding cues. Mom encouraged to waken baby for feeds.  Educated about newborn behavior. Referred to Baby and Me Book in Breastfeeding section Pg. 22-23 for position options and Proper latch demonstration.[place] brochure given w/resources, support groups and [place] services. Patient Name: Boy Delanda Bulluck [id number] Date: 12/22/2014 Reason for consult: Initial assessment   Maternal Data Has patient been taught Hand Expression?: Yes Does the patient have breastfeeding experience prior to this delivery?: No  Feeding Feeding Type: Breast Fed Length of feed: 0 min  LATCH Score/Interventions Latch: Too sleepy or reluctant, no latch achieved, no sucking elicited. Intervention(s): Skin to skin;Teach feeding cues;Waking techniques Intervention(s): Adjust position;Assist with latch;Breast massage;Breast compression  Audible Swallowing: None Intervention(s): Hand expression Intervention(s): Hand expression;Alternate breast massage  Type of Nipple: Everted at rest and after stimulation (small short shaft nipples)  Comfort (Breast/Nipple): Soft / non-tender     Hold (Positioning): Assistance needed to correctly position infant at breast and maintain latch. Intervention(s): Breastfeeding basics reviewed;Support Pillows;Position options;Skin to skin  LATCH Score: 5  Lactation Tools Discussed/Used Tools: Shells;Nipple  [place] Fuel;Pump Nipple shield size: 16 Shell Type: Inverted Breast pump type: Manual Pump Review: Setup, frequency, and cleaning;Milk Storage Initiated by:: Allayne Stack RN Date initiated:: 12/22/14   Consult Status Consult Status: Follow-up Date: 12/22/14 Follow-up type: In-patient    Theodoro Kalata 12/22/2014, 7:19 AM

## 2014-12-22 NOTE — Progress Notes (Signed)
Meagan Torres   Subjective: Post Partum Day 1 Vacuum assisted vaginal delivery, 3rd degree;Vaginal;Sulcus Patient up ad lib, denies syncope or dizziness. Reports consuming regular diet without issues and denies N/V No issues with urination and reports bleeding is appropriate  Feeding:  breast Contraceptive plan:   Not addressed  Objective: Temp:  [97.5 F (36.4 C)-100.1 F (37.8 C)] 98 F (36.7 C) (02/29 0515) Pulse Rate:  [86-132] 108 (02/29 0515) Resp:  [16-20] 16 (02/29 0515) BP: (106-157)/(64-95) 109/70 mmHg (02/29 0515) SpO2:  [99 %-100 %] 100 % (02/29 0515)  Physical Exam:  General: alert and cooperative Ext: WNL, no edema. No evidence of DVT seen on physical exam. Breast: Soft filling Lungs: CTAB Heart RRR without murmur Foley draining clear yellow urine Abdomen:  Soft, fundus firm, lochia scant, + bowel sounds, non distended, tender at the incision Lochia: appropriate Uterine Fundus: firm Laceration: healing well    Recent Labs  12/21/14 2311 12/22/14 0600  HGB 11.6* 10.0*  HCT 33.5* 28.6*    Assessment S/P Vaginal Delivery-Day 1 Stable  Normal Involution Breastfeeding Circumcision: no circ  Plan: Continue current care Dr. Charlesetta Garibaldi updated on patient status Foley to remain in until tongiht  Plan for discharge tomorrow and Breastfeeding Lactation support   Meagan Torres, CNM, MSN 12/22/2014, 1:36 PM

## 2014-12-22 NOTE — Lactation Note (Signed)
This note was copied from the chart of [place]. Lactation Consultation Note  Patient Name: Meagan Torres APOLI'D Date: 12/22/2014 Reason for consult: Follow-up assessment   Follow up visit at 62 hours old; Infant has breastfed x7 (10-20 min +40 min feeds) + 5 attempts in past 24 hours; voids-3 in 24 hours/ 4 life; stools-3 in 24 hours/life. Trying to latch in side-lying position.  Having difficulty getting baby's mouth opened wide.  LC gave tips and taught how to use asymmetrical latching technique.  Was not using nipple shield for current latching. Swallows heard.  LS-7.   Mom c/o sore nipples.  Comfort gels given and explained how to use. Educated on cluster feeding and encouraged to continue exclusive breastfeeding. Encouraged to call RN for assistance as needed during the night.    Maternal Data    Feeding Feeding Type: Breast Fed Length of feed: 10 min  LATCH Score/Interventions Latch: Repeated attempts needed to sustain latch, nipple held in mouth throughout feeding, stimulation needed to elicit sucking reflex. Intervention(s): Teach feeding cues Intervention(s): Breast massage;Breast compression  Audible Swallowing: A few with stimulation  Type of Nipple: Everted at rest and after stimulation  Comfort (Breast/Nipple): Filling, red/small blisters or bruises, mild/mod discomfort  Problem noted: Mild/Moderate discomfort Interventions (Mild/moderate discomfort): Comfort gels  Hold (Positioning): No assistance needed to correctly position infant at breast. Intervention(s): Breastfeeding basics reviewed;Support Pillows;Position options  LATCH Score: 7  Lactation Tools Discussed/Used WIC Program: No   Consult Status Consult Status: Follow-up Date: 12/23/14 Follow-up type: In-patient    Merlene Laughter 12/22/2014, 11:19 PM

## 2014-12-23 MED ORDER — IBUPROFEN 600 MG PO TABS: 600.0000 mg | ORAL_TABLET | Freq: Four times a day (QID) | ORAL | Status: DC | PRN

## 2014-12-23 MED ORDER — ACETAMINOPHEN-CODEINE #3 300-30 MG PO TABS: 1.0000 | ORAL_TABLET | Freq: Four times a day (QID) | ORAL | Status: DC | PRN

## 2014-12-23 NOTE — Discharge Instructions (Signed)

## 2014-12-23 NOTE — Lactation Note (Signed)
This note was copied from the chart of [place]. Lactation Consultation Note  Follow up assessment done prior to discharge.  Mom states baby cluster fed last night and nipples sore.  Nipples red and mom has comfort gels.  Observed mom latch baby but baby to shallow so relatched with breast compression.  Upper lip untucked.  Breasts are filling and good swallows heard.  Discharge teaching done including engorgement treatment and keeping a feeding diary.  Outpatient lactation services and support reviewed and encouraged.  Patient Name: Meagan Torres NHAFB'X Date: 12/23/2014 Reason for consult: Follow-up assessment;Breast/nipple pain   Maternal Data    Feeding Feeding Type: Breast Fed Length of feed: 10 min  LATCH Score/Interventions Latch: Grasps breast easily, tongue down, lips flanged, rhythmical sucking. Intervention(s): Skin to skin;Teach feeding cues;Waking techniques Intervention(s): Breast compression;Breast massage;Assist with latch;Adjust position  Audible Swallowing: Spontaneous and intermittent Intervention(s): Skin to skin Intervention(s): Hand expression;Alternate breast massage  Type of Nipple: Everted at rest and after stimulation  Comfort (Breast/Nipple): Filling, red/small blisters or bruises, mild/mod discomfort  Problem noted: Mild/Moderate discomfort Interventions (Mild/moderate discomfort): Comfort gels  Hold (Positioning): No assistance needed to correctly position infant at breast. Intervention(s): Breastfeeding basics reviewed;Support Pillows;Position options;Skin to skin  LATCH Score: 9  Lactation Tools Discussed/Used Tools: Comfort gels   Consult Status Consult Status: Complete    Glorine Hanratty S 12/23/2014, 11:19 AM

## 2014-12-23 NOTE — Discharge Summary (Signed)
Vaginal Delivery Discharge Summary  Meagan Torres  DOB:    1986/07/17 MRN:    [id number] CSN:    [id number]  Date of admission:                  Dec 19, 2014  Date of discharge:                   December 23, 2014  Procedures this admission:   VAVD with 3rd Degree Laceration   Date of Delivery: Dec 21, 2014  Newborn Data:  Live born female  Birth Weight: 8 lb 3 oz (3714 g) APGAR: 7, 9  Home with mother. Name: Name Circumcision Plan: None  History of Present Illness:  Meagan Torres is a 29 y.o. female, G1P1001, who presents at [redacted]w[redacted]d weeks gestation. The patient has been followed at the [facility] division of [facility]. She was admitted rupture of membranes. Her pregnancy has been complicated by:  Patient Active Problem List   Diagnosis Date Noted  . Vacuum extraction, delivered, current hospitalization 12/21/2014  . Third degree perineal laceration, delivered, current hospitalization 12/21/2014     Hospital Course:  Admitted for ROM. Negative GBS. Progressed with pitocin augmentation. Utilized epidural for pain management.  Delivery was performed by Dr. Florene Route with usage of vacuum necessary. Patient and baby tolerated the procedure without difficulty, with  3rd degree and right lateral vaginal side wall lacerations noted. Infant status was stable and remained in room with mother.  Mother and infant then had an uncomplicated postpartum course, with breast feeding going well. Mom's physical exam was WNL, and she was discharged home in stable condition. Contraception plan was NFP.  She received adequate benefit from po pain medications.   Feeding:  breast  Contraception:  natural family planning (NFP)  Discharge hemoglobin:  HEMOGLOBIN  Date Value Ref Range Status  12/22/2014 10.0* 12.0 - 15.0 g/dL Final   HCT  Date Value Ref Range Status  12/22/2014 28.6* 36.0 - 46.0 % Final    Discharge Physical Exam:    General: alert, cooperative and no distress  Chest: Lungs CTA, Heart RRR Breast: Soft, Filling, Nipples Intact Abdomen: Soft, Appropriately Tender, Distended Lochia: appropriate Uterine Fundus: firm Incision: None DVT Evaluation: No evidence of DVT seen on physical exam. No significant calf/ankle edema.  Intrapartum Procedures: vacuum Postpartum Procedures: none Complications-Operative and Postpartum: 3rd degree perineal laceration and vaginal laceration  Discharge Diagnoses: Term Pregnancy-delivered and Prolonged ROM, 3rd Degree and Vaginal Laceration  Discharge Information:  Activity:           pelvic rest Diet:                routine Medications: PNV, Tylenol #3, Ibuprofen and Colace Condition:      stable Instructions:  Pain Management, Peri-Care, Breastfeeding, Who and When to call for postpartum complications. Information Sheet(s) given: Postpartum Depression and Baby Blues  Discharge to: home  Follow-up Information    Follow up with [facility]. Schedule an appointment as soon as possible for a visit in 6 weeks.   Specialty:  Obstetrics and Gynecology   Why:  Please call if you have any questions or concerns prior to your next visit.   Contact information:   [address] [address] [phone]       Gavin Pound [facility], CNM 12/23/2014 10:33 AM

## 2014-12-23 NOTE — Progress Notes (Signed)
S: Patient resting in bed with infant.  Reports able to void x 1 since foley catheter removal this am.  Denies issues with urination and reports that bladder feels empty.  Further reports bleeding is "alright."   Nurse reports output of 230mL and bladder scanner reveals residual of 258mL.  O:  Filed Vitals:   12/23/14 0640  BP: 116/74  Pulse: 90  Temp: 98.1 F (36.7 C)  Resp: 18   A: PP Day 2 3rd Degree Laceration Questionable Urinary Retention  P: In to speak with patient who desires discharge Encouraged to attempt to urinate again Dr. Octavio Manns consulted and advised as below -Send urine for culture -Okay for discharge home after 2nd void -Follow up in office as appropriate  Nephi Savage, [place] MSN, CNM 1:35 PM 12/23/2014

## 2014-12-25 LAB — CULTURE, OB URINE: Colony Count: >=100000

## 2015-01-02 ENCOUNTER — Encounter (HOSPITAL_COMMUNITY): Payer: Self-pay | Admitting: *Deleted

## 2015-01-02 ENCOUNTER — Inpatient Hospital Stay (HOSPITAL_COMMUNITY)
Admission: AD | Admit: 2015-01-02 | Discharge: 2015-01-02 | Disposition: A | Discharge status: Home / Self Care | Payer: BC Managed Care – PPO | Source: Ambulatory Visit | Attending: Obstetrics and Gynecology | Admitting: Obstetrics and Gynecology

## 2015-01-02 DIAGNOSIS — O9089 Other complications of the puerperium, not elsewhere classified: Secondary | ICD-10-CM | POA: Insufficient documentation

## 2015-01-02 NOTE — MAU Provider Note (Signed)
MAU Addendum Note  Incision wiped with 2x2, area numbed with 1% lidocaine, incision repaired with 3 interrupted stitched using a 4.0 vicryl on SH needle   Plan: -continue with Postpartum orders as given on discharge - cyst bath BID x 7 days - pt to monitor for infection -Discharged to home in stable condition Consulted with Dr. Darci Current Iveth Heidemann, CNM, MSN 01/02/2015. 5:08 PM

## 2015-01-02 NOTE — MAU Note (Signed)
C/o bleeding form stitches (peritoneum;

## 2015-01-02 NOTE — MAU Note (Signed)
Pt presents complaining of bleeding from the site of her tear. Delivered 2/28. States she feels like they missed a spot.

## 2015-01-02 NOTE — MAU Provider Note (Signed)
Meagan Torres is a 29 y.o. G1P1 SP VAVD on 12/21/14 by Dr Margie Billet.  She c/o she has a area of her perineum that was not repaired and now it oozing pink tinged fluid.  She report her lochia and involution is appropriate.   History     Patient Active Problem List   Diagnosis Date Noted  . Vacuum extraction, delivered, current hospitalization 12/21/2014  . Third degree perineal laceration, delivered, current hospitalization 12/21/2014    Chief Complaint  Patient presents with  . Vaginal Bleeding   HPI  OB History    Gravida Para Term Preterm AB TAB SAB Ectopic Multiple Living   1 1 1       0 1      Past Medical History  Diagnosis Date  . Asthma     Past Surgical History  Procedure Laterality Date  . Wisdom tooth extraction    . No past surgeries      History reviewed. No pertinent family history.  History  Substance Use Topics  . Smoking status: Never Smoker   . Smokeless tobacco: Never Used  . Alcohol Use: Yes     Comment: occas before pregnancy    Allergies: No Known Allergies  Prescriptions prior to admission  Medication Sig Dispense Refill Last Dose  . acetaminophen-codeine (TYLENOL #3) 300-30 MG per tablet Take 1-2 tablets by mouth every 6 (six) hours as needed for moderate pain or severe pain (for a pain score greater than or equal to 7). 45 tablet 0 Past Week at Unknown time  . CEPHALEXIN PO Take 1 capsule by mouth 2 (two) times daily. 7 day dose.  2 more days left to take.   01/02/2015 at Unknown time  . ibuprofen (ADVIL,MOTRIN) 600 MG tablet Take 1 tablet (600 mg total) by mouth every 6 (six) hours as needed. 30 tablet 2 Past Week at Unknown time  . Prenatal Vit-Fe Fumarate-FA (PRENATAL MULTIVITAMIN) TABS tablet Take 1 tablet by mouth daily at 12 noon.   01/01/2015 at Unknown time    ROS See HPI above, all other systems are negative  Physical Exam   Blood pressure 122/89, pulse 89, temperature 98 F (36.7 C), temperature source Oral, resp. rate 18, height  5\' 1"  (1.549 m), weight 124 lb 9.6 oz (56.518 kg), unknown if currently breastfeeding.  Physical Exam 2cm long superficial laceration on the perineum is open and oozing pink tinged fluid   ED Course  Assessment: 2cm long superficial laceration on the perineum is open and oozing.   Plan: Consult with Dr. Derinda Sis per pt request   Mathan Darroch, CNM, MSN 01/02/2015. 4:40 PM

## 2017-10-24 NOTE — L&D Delivery Note (Addendum)
Delivery Note  First Stage: Pt. Admitted for IOL on 4/25 @ 4:45pm for intrauterine fetal demise at 22+2 weeks.  She had Cytotec 29mcg vaginally x 1, then 421mcg x 2 doses.  Labor onset: 02/16/18 @ 0215AM Augmentation : none  Analgesia Lorriane Shire intrapartum: epidural, Fentanyl 67mcg x 3   Second Stage: Complete dilation at 0820AM Onset of pushing at 0820AM  Delivery of a non-viable female "Emma" at [phone] by Lars Pinks, CNM en caul. No nuchal cord. SROM occurred immediately after delivery with bloody fluid, no odor. Fetus with multiple anomalies including cystic hygroma, fetal hydrops present, no FHTs, ascites noted over trunk.  Cord double clamped after cessation of pulsation, cut by CNM.  Third Stage: Small, calcified placenta with small umbilical cord delivered via Delena Bali intact with 3 VC @ [phone] Placenta disposition: pathology  Uterine tone firm with massage / bleeding minimal - a few small clots expressed from lower uterine segment.    No lacerations identified  Est. Blood Loss (mL): 215mL  Mom to 3rd floor postpartum .  Baby to [place].  Newborn: Birth Weight: 434grams (15.3oz) Apgar Scores: 0, 0  Pt. And husband declined sending karyotyping or microarray.  Will plan to send placenta to pathology.   Lars Pinks, MSN, CNM [facility]

## 2017-10-30 DIAGNOSIS — Z3481 Encounter for supervision of other normal pregnancy, first trimester: Secondary | ICD-10-CM | POA: Diagnosis not present

## 2017-10-30 DIAGNOSIS — Z3A01 Less than 8 weeks gestation of pregnancy: Secondary | ICD-10-CM | POA: Diagnosis not present

## 2017-10-30 DIAGNOSIS — Z3400 Encounter for supervision of normal first pregnancy, unspecified trimester: Secondary | ICD-10-CM | POA: Diagnosis not present

## 2017-10-30 DIAGNOSIS — Z3A22 22 weeks gestation of pregnancy: Secondary | ICD-10-CM | POA: Diagnosis not present

## 2017-11-16 DIAGNOSIS — Z3480 Encounter for supervision of other normal pregnancy, unspecified trimester: Secondary | ICD-10-CM | POA: Diagnosis not present

## 2017-11-16 LAB — OB RESULTS CONSOLE RUBELLA ANTIBODY, IGM: Rubella: IMMUNE

## 2017-11-16 LAB — OB RESULTS CONSOLE GC/CHLAMYDIA
Chlamydia: NEGATIVE
GC PROBE AMP, GENITAL: NEGATIVE

## 2017-11-16 LAB — OB RESULTS CONSOLE RPR: RPR: NONREACTIVE

## 2017-11-16 LAB — OB RESULTS CONSOLE HEPATITIS B SURFACE ANTIGEN: Hepatitis B Surface Ag: NEGATIVE

## 2017-11-16 LAB — OB RESULTS CONSOLE HIV ANTIBODY (ROUTINE TESTING): HIV: NONREACTIVE

## 2017-11-22 DIAGNOSIS — Z3491 Encounter for supervision of normal pregnancy, unspecified, first trimester: Secondary | ICD-10-CM | POA: Diagnosis not present

## 2017-11-23 ENCOUNTER — Other Ambulatory Visit: Payer: Self-pay

## 2017-12-05 ENCOUNTER — Encounter (HOSPITAL_COMMUNITY): Payer: Self-pay

## 2017-12-06 ENCOUNTER — Other Ambulatory Visit: Payer: Self-pay

## 2017-12-15 DIAGNOSIS — J101 Influenza due to other identified influenza virus with other respiratory manifestations: Secondary | ICD-10-CM | POA: Diagnosis not present

## 2017-12-25 ENCOUNTER — Other Ambulatory Visit (HOSPITAL_COMMUNITY): Payer: Self-pay | Admitting: Certified Nurse Midwife

## 2017-12-25 DIAGNOSIS — O358XX0 Maternal care for other (suspected) fetal abnormality and damage, not applicable or unspecified: Secondary | ICD-10-CM

## 2017-12-25 DIAGNOSIS — Z3689 Encounter for other specified antenatal screening: Secondary | ICD-10-CM

## 2017-12-25 DIAGNOSIS — Z3A16 16 weeks gestation of pregnancy: Secondary | ICD-10-CM

## 2017-12-25 DIAGNOSIS — Z3A15 15 weeks gestation of pregnancy: Secondary | ICD-10-CM

## 2017-12-27 ENCOUNTER — Encounter (HOSPITAL_COMMUNITY): Payer: Self-pay | Admitting: *Deleted

## 2017-12-28 ENCOUNTER — Ambulatory Visit (HOSPITAL_COMMUNITY)
Admission: RE | Admit: 2017-12-28 | Discharge: 2017-12-28 | Disposition: A | Discharge status: Home / Self Care | Payer: 59 | Source: Ambulatory Visit | Attending: Certified Nurse Midwife | Admitting: Certified Nurse Midwife

## 2017-12-28 ENCOUNTER — Encounter (HOSPITAL_COMMUNITY): Payer: Self-pay

## 2017-12-28 DIAGNOSIS — O3622X0 Maternal care for hydrops fetalis, second trimester, not applicable or unspecified: Secondary | ICD-10-CM | POA: Diagnosis not present

## 2017-12-28 DIAGNOSIS — O358XX0 Maternal care for other (suspected) fetal abnormality and damage, not applicable or unspecified: Secondary | ICD-10-CM | POA: Diagnosis present

## 2017-12-28 DIAGNOSIS — Z3A15 15 weeks gestation of pregnancy: Secondary | ICD-10-CM | POA: Insufficient documentation

## 2017-12-28 DIAGNOSIS — J45909 Unspecified asthma, uncomplicated: Secondary | ICD-10-CM | POA: Diagnosis not present

## 2017-12-28 DIAGNOSIS — Z3689 Encounter for other specified antenatal screening: Secondary | ICD-10-CM | POA: Diagnosis not present

## 2017-12-28 DIAGNOSIS — O9989 Other specified diseases and conditions complicating pregnancy, childbirth and the puerperium: Secondary | ICD-10-CM | POA: Diagnosis not present

## 2017-12-28 NOTE — Consult Note (Signed)
Maternal Fetal Medicine Consultation  Requesting Provider(s): Meagan Torres  Primary OB: Meagan Torres Reason for consultation: fetal cystic hygroma  HPI: 32yo P1001 at 15+5 weeks, who was noted to have a thickened nuchal translucency at 7.7 mm at 12+2 weeks. This was felt to be suspicious for cystic hygroma. Cell-free DNA testing was performed and was low risk for aneuploidy and 22q11.2 microdeletions OB History: OB History    Gravida Para Term Preterm AB Living   2 1 1     1    SAB TAB Ectopic Multiple Live Births         0 1    Uneventful pregnancy with term vacuum assisted vaginal delivery with third degree laceration  PMH:  Past Medical History:  Diagnosis Date  . Asthma     PSH:  Past Surgical History:  Procedure Laterality Date  . BASAL CELL CARCINOMA EXCISION    . NO PAST SURGERIES    . WISDOM TOOTH EXTRACTION     Meds: PNV Allergies: NKDA FH: No family history of congenital anomalies or genetic disorders Soc: Denies tobacco, alcohol or illicit drug use  Review of Systems: no vaginal bleeding or cramping/contractions, no LOF, no nausea/vomiting. All other systems reviewed and are negative.  PE VS: See EPIC GEN: well-appearing female ABD: gravid, NT  Please see separate document for fetal ultrasound report.  A/P: Fetal cystic hygroma without apparent genetic abnormality and early fetal hydrops. Suspected structural cardiac anomaly These findings were discussed in detail with the patient. Genetic counseling was also peformed, please see separate report for a summary of that visit and those plans. The patient and her partner understand that the presence of hydrops this early is a poor prognostic sign. This is in addition to the possibility of a structurally abnormal heart that makes the prognosis even more difficult. Pediatric cardiology will see the patient once she is past 18 weeks, and we will get that set up today I have asked her to return weekly for 2 visits so we can get  a measure of the course of the baby's hydrops. Further recommendations will be pending the results of any further genetic testing and the fetal echocardiogram  Thank you for the opportunity to be a part of the care of Meagan Torres. Please contact our office if we can be of further assistance.   I spent approximately 30 minutes with this patient with over 50% of time spent in face-to-face counseling.

## 2017-12-28 NOTE — Progress Notes (Signed)
Genetic Counseling  High-Risk Gestation Note  Appointment Date:  12/28/2017 Referred By: Artelia Laroche, CNM Date of Birth:  August 27, 1986 Partner:  Cleon Dew   Pregnancy History: [id number] Estimated Date of Delivery: 06/19/18 Estimated Gestational Age: 14w2dAttending: MGriffin Dakin MD   I met with Mrs. EShar Paezand her husband, Mr. JCeirra Belli for genetic counseling because of abnormal ultrasound findings.    In summary:  Discussed ultrasound findings in detail  Cystic hygroma, pleural effusion, small pericardial effusion and abnormal fetal heart anatomy visualized today  Reviewed results of previous screening  NIPS for aneuploidy ([facility]) within normal limits  Reviewed options for additional screening  NIPS for single gene conditions ([facility])- declined today  Echocardiogram- scheduled 01/18/18  Ongoing ultrasound- follow-up scheduled 01/04/18 and 01/11/18  Reviewed options for diagnostic testing, including risks, benefits, limitations and alternatives- declined amnio  Reviewed other explanations for ultrasound findings  Reviewed family history concerns  We began by reviewing the ultrasound in detail. Ultrasound today visualized cystic hygroma, bilateral pleural effusions, and small pericardial effusion. Abnormal fetal heart anatomy was visualized today, but given early gestational age, it could not be characterized today. Complete ultrasound results reported under separate cover.   They were counseled that a cystic hygroma describes a septated fluid filled sac at the back of the neck that typically results from failure of the fetal lymphatic system.  We discussed the various etiologies for a hygroma including normal variation (immature lymphatic system), a chromosome condition, single gene condition, or a congenital anomaly (heart defect).    We discussed chromosomes, non-disjunction, age related risks for aneuploidy and the ~50% ultrasound adjusted risk for a  chromosome condition based on the finding of a cystic hygroma.  We discussed the common features of Down syndrome, Turner syndrome, and trisomies 13 and 18.  Mrs. SKopkepreviously had noninvasive prenatal screening (NIPS)/prenatal cell free DNA (cfDNA) testing through her obstetrician's office.  We reviewed that while this testing has a high sensitivity and specificity, it is not considered to be diagnostic.  For trisomy 260and trisomy 130 the detection rate is 99%; for trisomy 16 the detection rate is 96%, and for Turner syndrome, the detection rate is ~92%.  We reviewed Mrs. Ewell's normal [facility] results and the associated significant reduction in risks for fetal aneuploidy.    We also discussed that cystic hygromas can also be caused by a variety of other chromosome aberrations including: microdeletions, microduplications, and translocations.  She was counseled that NIPS/cffDNA testing can detect a few microdeletions, but not those most commonly associated with cystic hygromas or abnormal nuchal translucency measurements.  We reviewed the availability of diagnostic testing (chromosome analysis) via amniocentesis.  They were counseled regarding the associated risks for amniocentesis including the associated risk for spontaneous pregnancy loss.  Additionally, we discussed that microarray analysis can be performed on cells obtained from amniocentesis.  They were counseled that microarray analysis is a molecular based technique in which a test sample of DNA (fetal) is compared to a reference (normal) genome in order to determine if the test sample has any extra or missing genetic information.  Microarray analysis allows for the detection of genetic deletions and duplications that are 1867times smaller than those identified by routine chromosome analysis.  We discussed that medical literature show that approximately 6% of patients with an abnormal fetal ultrasound and a normal fetal karyotype had a significant  microdeletion/microduplication detected by prenatal microarray analysis. After consideration of all the options, the couple declined amniocentesis at this  time.    We also discussed that a cystic hygroma can be a feature of many underlying single gene conditions, such as Noonan syndrome, Smith-Lemli-Opitz syndrome, and skeletal dysplasias.  We discussed that prenatal diagnosis is available for some single gene conditions, but that in most cases targeted analysis is recommended by a medical geneticist following a post-natal physical exam and review of medical records.  If however, ultrasound findings or a positive family history strongly suggest an increased risk for a specific condition, testing may be performed prenatally.   Regarding prenatal screening options for select single gene conditions, we discussed that there is a newer NIPS platform ([facility] through [place]) that is able to assess for mutations in a panel of 30 selected genes, including genes associated with Noonan syndrome. The conditions selected for this panel are autosomal dominant or X-linked conditions that typically occur due to de novo gene variations. We reviewed the methodology behind this screen and that the reported detection rate ranges from 43% to greater than 96%, depending upon the specific condition and specific gene. We reviewed possible results including positive, negative, unexpected findings, and no result. We reviewed limitations of the test including that it is not diagnostic, it relies on a high enough fetal fraction in the sample, and that if the patient carriers a mutation in one of the genes on the panel, analysis cannot be performed for the pregnancy. We also discussed that if a mutation is identified in the paternal sample, this will also be reported to the couple. We reviewed the billing process and possible cost of the test. The couple declined [facility] at this time but may consider pursuing pending results of follow-up  ultrasounds.   We discussed the option of carrier screening for a select panel of autosomal recessive and some X-linked conditions, some of which but not all can have cystic hygroma and fetal heart defects as an associated feature. ACOG currently recommends that all patients be offered carrier screening for cystic fibrosis, spinal muscular atrophy and hemoglobinopathies. In addition, they were counseled that there are a variety of genetic screening laboratories that have pan-ethnic, or expanded, carrier screening panels, which evaluate carrier status for a wide range of genetic conditions. Some of these conditions are severe and actionable, but also rare; others occur more commonly, but are less severe. We discussed that testing options range from screening for a single condition to panels of more than 200 autosomal or X-linked genetic conditions. The prevalence of each condition varies (and often varies with ethnicity). Thus the couples' background risk to be a carrier for each of these various conditions would range, and in some cases be very low or unknown. We reviewed that a negative carrier screen would thus reduce, but not eliminate the chance to be a carrier for these conditions. For some conditions included on specific pan-ethnic carrier screening panels, the phenotype may not yet be well defined. For the majority of conditions on pan-ethnic carrier screening panels, identification of carrier status is not expected to be associated with medical features for the carrier; However, there are currently few exceptions where carrier status has been shown to increase the chance for certain medical concerns. We reviewed that in the event that one partner is found to be a carrier for one or more conditions, carrier screening would be available to the partner for those conditions. We discussed the risks, benefits, and limitations of carrier screening with the couple. After thoughtful consideration of their options,  Ms. Daylani Deblois declined expanded pan-ethnic  carrier screening (including ACOG recommended panel) at this time.    We discussed that the fetal prognosis is largely dependent upon the underlying cause of the hygroma, but that there are indications by ultrasound, such as hydrops, which significantly increase the likelihood of a poor prognosis.  She understands that cystic hygromas may worsen and progress to hydrops fetalis, improve and regress, or remain unchanged at follow up ultrasounds.  Follow-up ultrasound is scheduled for 01/04/18 and 01/11/18. Fetal echocardiogram is scheduled with [facility] for 01/18/18.     We discussed the option of continuing the pregnancy versus termination of pregnancy. The couple plans to continue the pregnancy. They are not considering termination.      Both family histories were reviewed and found to be contributory for a female maternal first cousin once removed to Mrs. Lish with spina bifida. This relative is currently approximately 30 years old and uses a wheelchair. She was not reported to have signs or symptoms of conditions unrelated to spina bifida. We discussed that spina bifida is a common birth differences and affects approximately 1 in 1000 live births.  They were counseled that neural tube defects (NTDs) typically occur as an isolated finding, in which case multifactorial inheritance is typically suspected.  We also discussed that NTDs may occur as a feature of an underlying genetic syndrome or condition.  Approximately 5-10% of individuals who have spina bifida also have an underlying chromosome condition.  Given the reported family history, recurrence risk for open neural tube defects would not be expected to be increased for the patient's offspring, given the degree of relation and assuming multifactorial inheritance.   Without further information regarding the provided family history, an accurate genetic risk cannot be calculated. Further  genetic counseling is warranted if more information is obtained.    Mrs. Jividen denied exposure to environmental toxins or chemical agents. She denied the use of tobacco or street drugs. She reported drinking 3 glasses of wine in December, prior to being aware of the pregnancy and no alcohol use since that time. The all-or-none period was discussed, meaning exposures that occur in the first 4 weeks of gestation are typically thought to either not affect the pregnancy at all or result in a miscarriage. She denied significant viral illnesses during the course of her pregnancy.    I counseled this couple regarding the above risks and available options.  The approximate face-to-face time with the genetic counselor was 40 minutes.  Chipper Oman, MS Certified Genetic Counselor 12/28/2017

## 2017-12-29 ENCOUNTER — Other Ambulatory Visit (HOSPITAL_COMMUNITY): Payer: Self-pay | Admitting: *Deleted

## 2017-12-29 DIAGNOSIS — D181 Lymphangioma, any site: Secondary | ICD-10-CM

## 2018-01-02 ENCOUNTER — Ambulatory Visit (HOSPITAL_COMMUNITY): Payer: BC Managed Care – PPO

## 2018-01-04 ENCOUNTER — Encounter (HOSPITAL_COMMUNITY): Payer: Self-pay

## 2018-01-04 ENCOUNTER — Ambulatory Visit (HOSPITAL_COMMUNITY)
Admission: RE | Admit: 2018-01-04 | Discharge: 2018-01-04 | Disposition: A | Discharge status: Home / Self Care | Payer: 59 | Source: Ambulatory Visit | Attending: Certified Nurse Midwife | Admitting: Certified Nurse Midwife

## 2018-01-04 ENCOUNTER — Other Ambulatory Visit (HOSPITAL_COMMUNITY): Payer: Self-pay | Admitting: Obstetrics and Gynecology

## 2018-01-04 DIAGNOSIS — J45909 Unspecified asthma, uncomplicated: Secondary | ICD-10-CM | POA: Diagnosis not present

## 2018-01-04 DIAGNOSIS — O358XX0 Maternal care for other (suspected) fetal abnormality and damage, not applicable or unspecified: Secondary | ICD-10-CM | POA: Insufficient documentation

## 2018-01-04 DIAGNOSIS — O3622X0 Maternal care for hydrops fetalis, second trimester, not applicable or unspecified: Secondary | ICD-10-CM | POA: Diagnosis not present

## 2018-01-04 DIAGNOSIS — O99512 Diseases of the respiratory system complicating pregnancy, second trimester: Secondary | ICD-10-CM | POA: Diagnosis not present

## 2018-01-04 DIAGNOSIS — Z362 Encounter for other antenatal screening follow-up: Secondary | ICD-10-CM

## 2018-01-04 DIAGNOSIS — Z3A16 16 weeks gestation of pregnancy: Secondary | ICD-10-CM

## 2018-01-04 DIAGNOSIS — D181 Lymphangioma, any site: Secondary | ICD-10-CM

## 2018-01-05 ENCOUNTER — Other Ambulatory Visit (HOSPITAL_COMMUNITY): Payer: Self-pay | Admitting: *Deleted

## 2018-01-05 DIAGNOSIS — O359XX0 Maternal care for (suspected) fetal abnormality and damage, unspecified, not applicable or unspecified: Secondary | ICD-10-CM

## 2018-01-11 ENCOUNTER — Encounter (HOSPITAL_COMMUNITY): Payer: Self-pay

## 2018-01-11 ENCOUNTER — Ambulatory Visit (HOSPITAL_COMMUNITY)
Admission: RE | Admit: 2018-01-11 | Discharge: 2018-01-11 | Disposition: A | Discharge status: Home / Self Care | Payer: 59 | Source: Ambulatory Visit | Attending: Certified Nurse Midwife | Admitting: Certified Nurse Midwife

## 2018-01-11 DIAGNOSIS — Z3A17 17 weeks gestation of pregnancy: Secondary | ICD-10-CM | POA: Insufficient documentation

## 2018-01-11 DIAGNOSIS — O358XX0 Maternal care for other (suspected) fetal abnormality and damage, not applicable or unspecified: Secondary | ICD-10-CM | POA: Diagnosis not present

## 2018-01-11 DIAGNOSIS — J45909 Unspecified asthma, uncomplicated: Secondary | ICD-10-CM | POA: Insufficient documentation

## 2018-01-11 DIAGNOSIS — O352XX0 Maternal care for (suspected) hereditary disease in fetus, not applicable or unspecified: Secondary | ICD-10-CM | POA: Insufficient documentation

## 2018-01-11 DIAGNOSIS — O9989 Other specified diseases and conditions complicating pregnancy, childbirth and the puerperium: Secondary | ICD-10-CM | POA: Diagnosis not present

## 2018-01-11 DIAGNOSIS — D181 Lymphangioma, any site: Secondary | ICD-10-CM

## 2018-01-12 ENCOUNTER — Other Ambulatory Visit (HOSPITAL_COMMUNITY): Payer: Self-pay | Admitting: *Deleted

## 2018-01-12 DIAGNOSIS — O358XX0 Maternal care for other (suspected) fetal abnormality and damage, not applicable or unspecified: Secondary | ICD-10-CM

## 2018-01-17 ENCOUNTER — Ambulatory Visit (HOSPITAL_COMMUNITY): Payer: 59

## 2018-01-17 ENCOUNTER — Encounter (HOSPITAL_COMMUNITY): Payer: Self-pay

## 2018-01-18 DIAGNOSIS — J90 Pleural effusion, not elsewhere classified: Secondary | ICD-10-CM | POA: Diagnosis not present

## 2018-01-18 DIAGNOSIS — O358XX0 Maternal care for other (suspected) fetal abnormality and damage, not applicable or unspecified: Secondary | ICD-10-CM | POA: Diagnosis not present

## 2018-01-18 DIAGNOSIS — Z3A18 18 weeks gestation of pregnancy: Secondary | ICD-10-CM | POA: Diagnosis not present

## 2018-01-18 DIAGNOSIS — I340 Nonrheumatic mitral (valve) insufficiency: Secondary | ICD-10-CM | POA: Diagnosis not present

## 2018-01-18 DIAGNOSIS — R188 Other ascites: Secondary | ICD-10-CM | POA: Diagnosis not present

## 2018-01-22 DIAGNOSIS — Z3480 Encounter for supervision of other normal pregnancy, unspecified trimester: Secondary | ICD-10-CM | POA: Diagnosis not present

## 2018-02-01 ENCOUNTER — Encounter (HOSPITAL_COMMUNITY): Payer: Self-pay

## 2018-02-01 ENCOUNTER — Ambulatory Visit (HOSPITAL_COMMUNITY): Payer: 59

## 2018-02-15 ENCOUNTER — Inpatient Hospital Stay (HOSPITAL_COMMUNITY)
Admission: AD | Admit: 2018-02-15 | Discharge: 2018-02-16 | DRG: 807 | Disposition: A | Discharge status: Home / Self Care | Payer: 59 | Source: Ambulatory Visit | Attending: Obstetrics and Gynecology | Admitting: Obstetrics and Gynecology

## 2018-02-15 ENCOUNTER — Encounter (HOSPITAL_COMMUNITY): Payer: Self-pay | Admitting: *Deleted

## 2018-02-15 DIAGNOSIS — O364XX1 Maternal care for intrauterine death, fetus 1: Secondary | ICD-10-CM

## 2018-02-15 DIAGNOSIS — O364XX0 Maternal care for intrauterine death, not applicable or unspecified: Secondary | ICD-10-CM | POA: Diagnosis not present

## 2018-02-15 DIAGNOSIS — Z3A22 22 weeks gestation of pregnancy: Secondary | ICD-10-CM | POA: Diagnosis not present

## 2018-02-15 DIAGNOSIS — O358XX0 Maternal care for other (suspected) fetal abnormality and damage, not applicable or unspecified: Secondary | ICD-10-CM | POA: Diagnosis present

## 2018-02-15 LAB — CBC
HCT: 34.4 % — ABNORMAL LOW (ref 36.0–46.0)
Hemoglobin: 12 g/dL (ref 12.0–15.0)
MCH: 32 pg (ref 26.0–34.0)
MCHC: 34.9 g/dL (ref 30.0–36.0)
MCV: 91.7 fL (ref 78.0–100.0)
Platelets: 294 10*3/uL (ref 150–400)
RBC: 3.75 MIL/uL — ABNORMAL LOW (ref 3.87–5.11)
RDW: 14 % (ref 11.5–15.5)
WBC: 11.1 10*3/uL — ABNORMAL HIGH (ref 4.0–10.5)

## 2018-02-15 LAB — TYPE AND SCREEN
ABO/RH(D): O POS
Antibody Screen: NEGATIVE

## 2018-02-15 MED ORDER — SOD CITRATE-CITRIC ACID 500-334 MG/5ML PO SOLN: 30.0000 mL | ORAL | Status: DC | PRN

## 2018-02-15 MED ORDER — MISOPROSTOL 200 MCG PO TABS
400.0000 ug | ORAL_TABLET | Freq: Once | ORAL | Status: AC
  Administered 2018-02-15: 400 ug via VAGINAL
  Filled 2018-02-15: qty 2

## 2018-02-15 MED ORDER — FENTANYL CITRATE (PF) 100 MCG/2ML IJ SOLN
50.0000 ug | Freq: Once | INTRAMUSCULAR | Status: AC
  Administered 2018-02-15: 50 ug via INTRAVENOUS
  Filled 2018-02-15: qty 2

## 2018-02-15 MED ORDER — LACTATED RINGERS IV SOLN
500.0000 mL | INTRAVENOUS | Status: DC | PRN
  Administered 2018-02-16: 500 mL via INTRAVENOUS

## 2018-02-15 MED ORDER — OXYTOCIN BOLUS FROM INFUSION
500.0000 mL | Freq: Once | INTRAVENOUS | Status: AC
  Administered 2018-02-16: 500 mL via INTRAVENOUS

## 2018-02-15 MED ORDER — PHENYLEPHRINE 40 MCG/ML (10ML) SYRINGE FOR IV PUSH (FOR BLOOD PRESSURE SUPPORT)
80.0000 ug | PREFILLED_SYRINGE | INTRAVENOUS | Status: DC | PRN
  Filled 2018-02-15: qty 10

## 2018-02-15 MED ORDER — LIDOCAINE HCL (PF) 1 % IJ SOLN
30.0000 mL | INTRAMUSCULAR | Status: DC | PRN
  Filled 2018-02-15: qty 30

## 2018-02-15 MED ORDER — LACTATED RINGERS IV SOLN: 500.0000 mL | Freq: Once | INTRAVENOUS | Status: DC

## 2018-02-15 MED ORDER — OXYTOCIN 40 UNITS IN LACTATED RINGERS INFUSION - SIMPLE MED
2.5000 [IU]/h | INTRAVENOUS | Status: DC
  Filled 2018-02-15: qty 1000

## 2018-02-15 MED ORDER — LACTATED RINGERS IV SOLN
INTRAVENOUS | Status: DC
  Administered 2018-02-15 – 2018-02-16 (×2): via INTRAVENOUS

## 2018-02-15 MED ORDER — OXYCODONE-ACETAMINOPHEN 5-325 MG PO TABS: 1.0000 | ORAL_TABLET | ORAL | Status: DC | PRN

## 2018-02-15 MED ORDER — ONDANSETRON HCL 4 MG/2ML IJ SOLN
4.0000 mg | Freq: Four times a day (QID) | INTRAMUSCULAR | Status: DC | PRN
Start: 2018-02-15 — End: 2018-02-16

## 2018-02-15 MED ORDER — MISOPROSTOL 200 MCG PO TABS
200.0000 ug | ORAL_TABLET | ORAL | Status: DC | PRN
  Administered 2018-02-15: 200 ug via VAGINAL
  Filled 2018-02-15: qty 1

## 2018-02-15 MED ORDER — ACETAMINOPHEN 325 MG PO TABS
650.0000 mg | ORAL_TABLET | ORAL | Status: DC | PRN
  Administered 2018-02-15: 650 mg via ORAL
  Filled 2018-02-15: qty 2

## 2018-02-15 MED ORDER — EPHEDRINE 5 MG/ML INJ: 10.0000 mg | INTRAVENOUS | Status: DC | PRN

## 2018-02-15 MED ORDER — FLEET ENEMA 7-19 GM/118ML RE ENEM: 1.0000 | ENEMA | RECTAL | Status: DC | PRN

## 2018-02-15 MED ORDER — OXYCODONE-ACETAMINOPHEN 5-325 MG PO TABS: 2.0000 | ORAL_TABLET | ORAL | Status: DC | PRN

## 2018-02-15 MED ORDER — PHENYLEPHRINE 40 MCG/ML (10ML) SYRINGE FOR IV PUSH (FOR BLOOD PRESSURE SUPPORT): 80.0000 ug | PREFILLED_SYRINGE | INTRAVENOUS | Status: DC | PRN

## 2018-02-15 MED ORDER — FENTANYL 2.5 MCG/ML BUPIVACAINE 1/10 % EPIDURAL INFUSION (WH - ANES)
14.0000 mL/h | INTRAMUSCULAR | Status: DC | PRN
  Administered 2018-02-16: 14 mL/h via EPIDURAL
  Filled 2018-02-15 (×2): qty 100

## 2018-02-15 MED ORDER — DIPHENHYDRAMINE HCL 50 MG/ML IJ SOLN: 12.5000 mg | INTRAMUSCULAR | Status: DC | PRN

## 2018-02-15 NOTE — Progress Notes (Signed)
S:  Pt. Was able to rest for over an hour.  Reports feeling some abdominal tightening and occasional pressure, but nothing painful.  Declines IV pain medication or epidural at this time. Spouse at bedside.  Comfort cart available in room.   O:  VS: Blood pressure 118/68, pulse (!) 104, temperature 98.2 F (36.8 C), temperature source Oral, resp. rate 18, height 5\' 1"  (1.549 m), weight 59 kg (130 lb), last menstrual period 09/12/2017, unknown if currently breastfeeding.        Cervix : deferred        Membranes: intact  A: 22+2 week IOL for IUFD  P: Continuous support from CNM, spouse      Reassess cervix at 10pm to evaluate for 2nd dose of Cytotec      Anticipate vaginal delivery   Lars Pinks, MSN, CNM [facility]

## 2018-02-15 NOTE — Progress Notes (Signed)
S:  Starting to feel more intense cramping and a few stronger contractions.  Report it is tolerable and declines epidural at this time.  Still desires epidural when pain gets more intense.   O: General: appropriately tearful at times       VS: Blood pressure 118/68, pulse (!) 104, temperature 98.2 F (36.8 C), temperature source Oral, resp. rate 18, height 5\' 1"  (1.549 m), weight 59 kg (130 lb), last menstrual period 09/12/2017, unknown if currently breastfeeding.        FHR : N/A        Cervix : Dilation: Fingertip Cervical Position: Middle Exam by:: Sigmon, CNM        Membranes: Intact  A: 22+2 weeks IOL for IUFD  P: 443mcg Cytotec placed vaginally       50 mcg Fentanyl given x 1 prior to placement      Epidural when ready       Anticipate vaginal delivery     Lars Pinks, MSN, CNM [facility]

## 2018-02-15 NOTE — H&P (Addendum)
OB ADMISSION/ HISTORY & PHYSICAL:  Admission Date: 02/15/2018  3:23 PM  Admit Diagnosis: 22+[redacted] weeks gestation Intrauterine fetal demise   Meagan Torres is a 32 y.o. female G2P1 at 22+2 weeks presenting from the office with an intrauterine fetal demise for induction of labor. Today, she presented for a comprehensive anatomy scan, and there were no fetal heart tones present, confirmed by ultrasonographer and Dr. Benjie Karvonen.  The baby was diagnosed with a cystic hygroma at [redacted] weeks gestation.  She was seen by MFM to confirm cystic hygroma with early fetal hydrops.  She had [facility] genetic testing, which was low-risk.  She also had a fetal echo showing a ventricular septal defect, bilateral pleural effusion, ascites.  The couple also had genetic counseling and declined amniocentesis, further genetic testing, and termination.    Prenatal History: G2P1001   EDC : 06/19/2018, by Last Menstrual Period  Prenatal care at [facility] since 9 weeks  Primary Ob Provider: Emelda Fear, CNM  Prenatal course complicated by - Cystic hygroma at [redacted] weeks gestation.   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 Of basal cell carcinoma ([id number])  Prenatal Labs: ABO, Rh: --/--/O POS (04/25 1645) Antibody: NEG (04/25 1645) Rubella:   Immune RPR:   NR HBsAg:   Negative HIV:   NR  Medical / Surgical History :  Past medical history:  Past Medical History:  Diagnosis Date  . Asthma      Past surgical history:  Past Surgical History:  Procedure Laterality Date  . BASAL CELL CARCINOMA EXCISION    . NO PAST SURGERIES    . WISDOM TOOTH EXTRACTION      Family History: History reviewed. No pertinent family history.   Social History:  reports that she has never smoked. She has never used smokeless tobacco. She reports that she drinks alcohol. She  reports that she does not use drugs.   Allergies: Patient has no known allergies.    Current Medications at time of admission:  Prior to Admission medications   Medication Sig Start Date End Date Taking? Authorizing Provider  acetaminophen-codeine (TYLENOL #3) 300-30 MG per tablet Take 1-2 tablets by mouth every 6 (six) hours as needed for moderate pain or severe pain (for a pain score greater than or equal to 7). Patient not taking: Reported on 01/04/2018 12/23/14   Gavin Pound, CNM  CEPHALEXIN PO Take 1 capsule by mouth 2 (two) times daily. 7 day dose.  2 more days left to take.    [provider]  ibuprofen (ADVIL,MOTRIN) 600 MG tablet Take 1 tablet (600 mg total) by mouth every 6 (six) hours as needed. Patient not taking: Reported on 01/04/2018 12/23/14   Gavin Pound, CNM  Prenatal Vit-Fe Fumarate-FA (PRENATAL MULTIVITAMIN) TABS tablet Take 1 tablet by mouth daily at 12 noon.    [provider]     Review of Systems: No FM No contractions No LOF  / SROM  No bloody show  Reports feeling anxious about induction process    Physical Exam:  VS: Height 5\' 1"  (1.549 m), weight 59 kg (130 lb), last menstrual period 09/12/2017, unknown if currently breastfeeding.  General: alert and oriented, appropriately tearful and anxious Heart: RRR Lungs: Clear lung fields Abdomen: Gravid, soft and non-tender, non-distended / uterus: gravid non-tender Extremities: no  edema  Genitalia / VE:  closed/thick/soft   Pt. Asked me to attempt to doppler FHTs prior to starting induction.  No fetal heart tones auscultated; Offered bedside ultrasound, but pt. Declined.   Assessment: IOL at 22+2 weeks for intrauterine fetal demise     Plan:  1. Admit to [facility]   - Routine labor and delivery orders   - Pain management: 69mcg Fentanyl prior to initial Cytotec placement; plan for early epidural    - Induction plan: Cytotec 212mcg vaginally, then 449mcg if next dose needed  in 4 hours    - Chaplain services offered, but pt. And husband decline at this time   - Pt. And husband considering karyotyping and microarray - will notify me at the time of delivery   - Unsure of plan for burial or cremation: will have list of services available    - Comfort care cart    - Anticipate vaginal delivery   Dr. Murrell Redden notified of admission / plan of care  Lars Pinks, MSN, CNM [facility]

## 2018-02-16 ENCOUNTER — Inpatient Hospital Stay (HOSPITAL_COMMUNITY): Payer: 59 | Admitting: Anesthesiology

## 2018-02-16 ENCOUNTER — Encounter (HOSPITAL_COMMUNITY): Payer: Self-pay | Admitting: *Deleted

## 2018-02-16 LAB — RPR: RPR Ser Ql: NONREACTIVE

## 2018-02-16 MED ORDER — COCONUT OIL OIL
1.0000 "application " | TOPICAL_OIL | Status: DC | PRN
  Filled 2018-02-16: qty 120

## 2018-02-16 MED ORDER — BENZOCAINE-MENTHOL 20-0.5 % EX AERO: 1.0000 "application " | INHALATION_SPRAY | CUTANEOUS | Status: DC | PRN

## 2018-02-16 MED ORDER — ONDANSETRON HCL 4 MG/2ML IJ SOLN: 4.0000 mg | INTRAMUSCULAR | Status: DC | PRN

## 2018-02-16 MED ORDER — ACETAMINOPHEN 325 MG PO TABS: 650.0000 mg | ORAL_TABLET | ORAL | Status: DC | PRN

## 2018-02-16 MED ORDER — MISOPROSTOL 200 MCG PO TABS
ORAL_TABLET | ORAL | Status: AC
  Filled 2018-02-16: qty 2

## 2018-02-16 MED ORDER — FENTANYL 2.5 MCG/ML BUPIVACAINE 1/10 % EPIDURAL INFUSION (WH - ANES): 14.0000 mL/h | INTRAMUSCULAR | Status: DC | PRN

## 2018-02-16 MED ORDER — WITCH HAZEL-GLYCERIN EX PADS: 1.0000 "application " | MEDICATED_PAD | CUTANEOUS | Status: DC | PRN

## 2018-02-16 MED ORDER — DIBUCAINE 1 % RE OINT
1.0000 "application " | TOPICAL_OINTMENT | RECTAL | Status: DC | PRN
  Filled 2018-02-16: qty 28

## 2018-02-16 MED ORDER — DIPHENHYDRAMINE HCL 25 MG PO CAPS: 25.0000 mg | ORAL_CAPSULE | Freq: Four times a day (QID) | ORAL | Status: DC | PRN

## 2018-02-16 MED ORDER — ZOLPIDEM TARTRATE 5 MG PO TABS: 5.0000 mg | ORAL_TABLET | Freq: Every evening | ORAL | Status: DC | PRN

## 2018-02-16 MED ORDER — LIDOCAINE HCL (PF) 1 % IJ SOLN
INTRAMUSCULAR | Status: DC | PRN
  Administered 2018-02-16 (×3): 3 mL

## 2018-02-16 MED ORDER — PRENATAL MULTIVITAMIN CH: 1.0000 | ORAL_TABLET | Freq: Every day | ORAL | Status: DC

## 2018-02-16 MED ORDER — MISOPROSTOL 200 MCG PO TABS
400.0000 ug | ORAL_TABLET | Freq: Once | ORAL | Status: AC
  Administered 2018-02-16: 400 ug via VAGINAL

## 2018-02-16 MED ORDER — SENNOSIDES-DOCUSATE SODIUM 8.6-50 MG PO TABS: 2.0000 | ORAL_TABLET | ORAL | Status: DC

## 2018-02-16 MED ORDER — SIMETHICONE 80 MG PO CHEW: 80.0000 mg | CHEWABLE_TABLET | ORAL | Status: DC | PRN

## 2018-02-16 MED ORDER — IBUPROFEN 600 MG PO TABS: 600.0000 mg | ORAL_TABLET | Freq: Four times a day (QID) | ORAL | 0 refills | Status: DC

## 2018-02-16 MED ORDER — FENTANYL CITRATE (PF) 100 MCG/2ML IJ SOLN
INTRAMUSCULAR | Status: AC
  Administered 2018-02-16: 50 ug
  Filled 2018-02-16: qty 2

## 2018-02-16 MED ORDER — ONDANSETRON HCL 4 MG PO TABS: 4.0000 mg | ORAL_TABLET | ORAL | Status: DC | PRN

## 2018-02-16 MED ORDER — IBUPROFEN 600 MG PO TABS: 600.0000 mg | ORAL_TABLET | Freq: Four times a day (QID) | ORAL | Status: DC

## 2018-02-16 NOTE — Anesthesia Procedure Notes (Signed)
Epidural Patient location during procedure: OB Start time: 02/16/2018 1:45 AM End time: 02/16/2018 2:05 AM  Staffing Anesthesiologist: Nolon Nations, MD Performed: anesthesiologist   Preanesthetic Checklist Completed: patient identified, pre-op evaluation, timeout performed, IV checked, risks and benefits discussed and monitors and equipment checked  Epidural Patient position: sitting Prep: site prepped and draped and DuraPrep Patient monitoring: heart rate, continuous pulse ox and blood pressure Approach: midline Location: L2-L3 Injection technique: LOR air and LOR saline  Needle:  Needle type: Tuohy  Needle gauge: 17 G Needle length: 9 cm Needle insertion depth: 5 cm Catheter type: closed end flexible Catheter size: 19 Gauge Catheter at skin depth: 10 cm Test dose: negative  Assessment Sensory level: T8 Events: blood aspirated, injection not painful, no injection resistance, negative IV test and paresthesia  Additional Notes She appears to have leftward scoliosis. Attempt at L3-4, blood aspirated from catheter. Moved to L2-3, but paresthesia down R leg. Needle aimed more to left, catheter threaded easily, no blood aspirated.Reason for block:procedure for pain

## 2018-02-16 NOTE — Anesthesia Preprocedure Evaluation (Addendum)
Anesthesia Evaluation  Patient identified by MRN, date of birth, ID band Patient awake    Reviewed: Allergy & Precautions, Patient's Chart, lab work & pertinent test results  History of Anesthesia Complications Negative for: history of anesthetic complications  Airway Mallampati: I  TM Distance: >3 FB Neck ROM: Full    Dental  (+) Teeth Intact   Pulmonary neg shortness of breath, asthma , neg sleep apnea, neg COPD, neg recent URI,    breath sounds clear to auscultation       Cardiovascular negative cardio ROS   Rhythm:Regular     Neuro/Psych negative neurological ROS  negative psych ROS   GI/Hepatic negative GI ROS, Neg liver ROS,   Endo/Other  negative endocrine ROS  Renal/GU negative Renal ROS     Musculoskeletal   Abdominal   Peds  Hematology negative hematology ROS (+)   Anesthesia Other Findings   Reproductive/Obstetrics (+) Pregnancy                            Anesthesia Physical  Anesthesia Plan  ASA: II  Anesthesia Plan: Epidural   Post-op Pain Management:    Induction:   PONV Risk Score and Plan:   Airway Management Planned:   Additional Equipment:   Intra-op Plan:   Post-operative Plan:   Informed Consent: I have reviewed the patients History and Physical, chart, labs and discussed the procedure including the risks, benefits and alternatives for the proposed anesthesia with the patient or authorized representative who has indicated his/her understanding and acceptance.     Plan Discussed with: Anesthesiologist  Anesthesia Plan Comments:         Anesthesia Quick Evaluation

## 2018-02-16 NOTE — Discharge Instructions (Signed)
Vaginal Delivery, Care After Refer to this sheet in the next few weeks. These instructions provide you with information on caring for yourself after your delivery. Your health care provider may also give you more specific instructions. Your treatment has been planned according to current medical practices, but problems sometimes occur. Call your health care provider if you have any problems or questions after your delivery. What to expect after your delivery After your delivery, it is typical to have the following:  You may feel pain in the vaginal area for several days after delivery. If you had an incision or a vaginal tear, the area will probably continue to be tender to the touch for several weeks.  You may feel very fatigued after a vaginal delivery.  You may have vaginal bleeding and discharge that will start out red, then become pink, then yellow, then white. Altogether, this usually lasts for about 6 weeks.  The combination of having lost your baby and changing hormones from the delivery can make you feel very sad. You may also experience emotions that change very quickly. Some of the emotions people often notice after loss include: ? Anger. ? Denial. ? Guilt. ? Sorrow. ? Depression. ? Grief. ? Relationship problems.  Follow these instructions at home:  Consider seeking support for your loss. Some forms of support that you might consider include your religious leader, friends, family, a Medical illustrator, or a bereavement support group.  Take medicines only as directed by your health care provider.  Continue to use good perineal care. Good perineal care includes: ? Wiping your perineum from front to back. ? Keeping your perineum clean.  Do not use tampons or douche until your health care provider says it is okay.  Shower, wash your hair, and take tub baths as directed by your health care provider.  Wear a well-fitting bra that provides breast support.  Drink enough  fluids to keep your urine clear or pale yellow.  Eat healthy foods.  Eat high-fiber foods every day, such as whole grain cereals and breads, brown rice, beans, and fresh fruits and vegetables. These foods may help prevent or relieve constipation.  Follow your health care provider's directions about resuming activities such as climbing stairs, driving, lifting, exercising, or traveling.  Increase your activities gradually.  Talk to your health care provider about resuming sexual activities. This depends on your risk of infection, your rate of healing, and your comfort and desire to resume sexual activity.  Try to have someone help you with your household activities for at least a few days after you leave the hospital.  Rest as much as possible.  Keep all of your scheduled postpartum appointments. It is very important to keep your scheduled follow-up appointments. At these appointments, your health care provider will be checking to make sure that you are healing physically and emotionally.  Do not drink alcohol, especially if you are taking medicine to relieve pain.  Do not use any tobacco products including cigarettes, chewing tobacco, or electronic cigarettes. If you need help quitting, ask your health care provider.  Do not use illegal drugs. Contact a health care provider if:  You feel sad or depressed.  You have thoughts of hurting yourself.  You are having trouble eating or sleeping.  You cannot enjoy the things in life you have previously enjoyed.  You are passing large clots from your vagina. Save any clots to show your health care provider.  You have a bad smelling discharge from your vagina.  You have trouble urinating.  You are urinating frequently.  You have pain when you urinate.  You have a change in your bowel movements.  You have increasing redness, pain, or swelling near your incision or vaginal tear.  You have pus draining from your incision or vaginal  tear.  Your incision or vaginal tear is separating.  You have painful, hard, or reddened breasts.  You have a severe headache.  You have blurred vision or see spots.  You are dizzy or light-headed.  You have a rash.  You have nausea or vomiting.  You have not had a menstrual period by the 12th week after delivery.  You have a fever. Get help right away if:  You are concerned that you may hurt yourself or you are considering suicide.  You have persistent pain.  You have chest pain.  You have shortness of breath.  You faint.  You have leg pain.  You have stomach pain.  Your vaginal bleeding saturates two or more sanitary pads in 1 hour. This information is not intended to replace advice given to you by your health care provider. Make sure you discuss any questions you have with your health care provider. Document Released: 02/24/2014 Document Revised: 03/17/2016 Document Reviewed: 11/28/2013 Elsevier Interactive Patient Education  2018 [employer] [place]. Vaginal Delivery, Care After Refer to this sheet in the next few weeks. These instructions provide you with information on caring for yourself after your delivery. Your health care provider may also give you more specific instructions. Your treatment has been planned according to current medical practices, but problems sometimes occur. Call your health care provider if you have any problems or questions after your delivery. What to expect after your delivery After your delivery, it is typical to have the following:  You may feel pain in the vaginal area for several days after delivery. If you had an incision or a vaginal tear, the area will probably continue to be tender to the touch for several weeks.  You may feel very fatigued after a vaginal delivery.  You may have vaginal bleeding and discharge that will start out red, then become pink, then yellow, then white. Altogether, this usually lasts for about 6 weeks.  The  combination of having lost your baby and changing hormones from the delivery can make you feel very sad. You may also experience emotions that change very quickly. Some of the emotions people often notice after loss include: ? Anger. ? Denial. ? Guilt. ? Sorrow. ? Depression. ? Grief. ? Relationship problems.  Follow these instructions at home:  Consider seeking support for your loss. Some forms of support that you might consider include your religious leader, friends, family, a Medical illustrator, or a bereavement support group.  Take medicines only as directed by your health care provider.  Continue to use good perineal care. Good perineal care includes: ? Wiping your perineum from front to back. ? Keeping your perineum clean.  Do not use tampons or douche until your health care provider says it is okay.  Shower, wash your hair, and take tub baths as directed by your health care provider.  Wear a well-fitting bra that provides breast support.  Drink enough fluids to keep your urine clear or pale yellow.  Eat healthy foods.  Eat high-fiber foods every day, such as whole grain cereals and breads, brown rice, beans, and fresh fruits and vegetables. These foods may help prevent or relieve constipation.  Follow your health care provider's directions about  resuming activities such as climbing stairs, driving, lifting, exercising, or traveling.  Increase your activities gradually.  Talk to your health care provider about resuming sexual activities. This depends on your risk of infection, your rate of healing, and your comfort and desire to resume sexual activity.  Try to have someone help you with your household activities for at least a few days after you leave the hospital.  Rest as much as possible.  Keep all of your scheduled postpartum appointments. It is very important to keep your scheduled follow-up appointments. At these appointments, your health care provider will be  checking to make sure that you are healing physically and emotionally.  Do not drink alcohol, especially if you are taking medicine to relieve pain.  Do not use any tobacco products including cigarettes, chewing tobacco, or electronic cigarettes. If you need help quitting, ask your health care provider.  Do not use illegal drugs. Contact a health care provider if:  You feel sad or depressed.  You have thoughts of hurting yourself.  You are having trouble eating or sleeping.  You cannot enjoy the things in life you have previously enjoyed.  You are passing large clots from your vagina. Save any clots to show your health care provider.  You have a bad smelling discharge from your vagina.  You have trouble urinating.  You are urinating frequently.  You have pain when you urinate.  You have a change in your bowel movements.  You have increasing redness, pain, or swelling near your incision or vaginal tear.  You have pus draining from your incision or vaginal tear.  Your incision or vaginal tear is separating.  You have painful, hard, or reddened breasts.  You have a severe headache.  You have blurred vision or see spots.  You are dizzy or light-headed.  You have a rash.  You have nausea or vomiting.  You have not had a menstrual period by the 12th week after delivery.  You have a fever. Get help right away if:  You are concerned that you may hurt yourself or you are considering suicide.  You have persistent pain.  You have chest pain.  You have shortness of breath.  You faint.  You have leg pain.  You have stomach pain.  Your vaginal bleeding saturates two or more sanitary pads in 1 hour. This information is not intended to replace advice given to you by your health care provider. Make sure you discuss any questions you have with your health care provider. Document Released: 02/24/2014 Document Revised: 03/17/2016 Document Reviewed: 11/28/2013 Elsevier  Interactive Patient Education  2018 [employer].

## 2018-02-16 NOTE — Progress Notes (Signed)
Eddie Dibbles, CNM at bedside doing discharge teaching, FOB at bedside, pt verbalizes understanding, no further questions at this time

## 2018-02-16 NOTE — Progress Notes (Signed)
S:  Comfortable with epidural. Tearful knowing she is getting closer to delivery. We have spent time talking through everything, and pt. Feels more comforted. Very open to talking about current situation.   O:  VS: Blood pressure 123/76, pulse 93, temperature 98.2 F (36.8 C), temperature source Oral, resp. rate 16, height 5\' 1"  (1.549 m), weight 59 kg (130 lb), last menstrual period 09/12/2017, SpO2 99 %, unknown if currently breastfeeding.               Cervix : Dilation: 3 Effacement (%): 50 Cervical Position: Middle Station: -3 Exam by:: Sigmon, CNM        Membranes: intact  A: 22+2 week IUFD  P: On cervical exam, prior Cytotec tablets palpated, felt as if starting to disintegrate, but we will defer 3rd dose of Cytotec for now.  I moved tablets to posterior fornix.  Encouraged pt. And husband to rest. Continuous support provided.  They are considering not doing karyotyping and microarray.  They will determine at time of delivery. Anticipate vaginal delivery.     Lars Pinks, MSN, CNM [facility]

## 2018-02-16 NOTE — Progress Notes (Signed)
S:  Pt. Was able to sleep some tonight.  Starting to get very uncomfortable with contractions.  Requesting epidural now.   O:  VS: Blood pressure 128/75, pulse 100, temperature 98.2 F (36.8 C), temperature source Oral, resp. rate 16, height 5\' 1"  (1.549 m), weight 59 kg (130 lb), last menstrual period 09/12/2017, unknown if currently breastfeeding.        FHR: N/A        Cervix : deferred        Membranes: intact  A: 22+2 week IUFD  P: Epidural now     Will reassess cervix when comfortable      Next Cytotec due 0215AM if needed    Continuous emotional support provided     Lars Pinks, MSN, CNM [facility]

## 2018-02-16 NOTE — Progress Notes (Signed)
S:  Pt. Reports being able to sleep some overnight, but feels like epidural is wearing off.  Reports like vaginal tissue is swollen/feeling some pressure.   O:  VS: Blood pressure 117/74, pulse 96, temperature 98.2 F (36.8 C), temperature source Oral, resp. rate 16, height 5\' 1"  (1.549 m), weight 59 kg (130 lb), last menstrual period 09/12/2017, SpO2 98 %, unknown if currently breastfeeding.        Cervix : Dilation: 4 Effacement (%): 50 Cervical Position: Anterior Station: -3 Exam by:: Evren Shankland, CNM  Bloody show present         Membranes: Intact  A: 22+3 weeks IUFD  P: 48mcg Cytotec placed vaginally     Epidural PCA encouraged      Continuous emotional support offered     Anticipate vaginal delivery    Lars Pinks, MSN, CNM [facility]

## 2018-02-16 NOTE — Discharge Summary (Signed)
OB Discharge Summary     Patient Name: Meagan Torres DOB: 02/20/1986 MRN: [id number]  Date of admission: 02/15/2018 Delivering MD: Lars Pinks C   Date of discharge: 02/16/2018  Admitting diagnosis: 22WKS IUFD Intrauterine pregnancy: [redacted]w[redacted]d     Secondary diagnosis:  Principal Problem:   Fetal demise, greater than 22 weeks, antepartum, fetus 1 Active Problems:   Cystic hygroma of fetus in singleton pregnancy   Fetal hydrops   Labor abnormality     Discharge diagnosis: vaginal delivery IUFD 22 weeks, fetal anomalies                                                                                                Post partum procedures: none  Augmentation: Cytotec  Complications: None and [facility] course:  Induction of Labor With Vaginal Delivery   32 y.o. yo G2P1101 at [redacted]w[redacted]d was admitted to the hospital 02/15/2018 for induction of labor.  Indication for induction: fetal demise.  Patient had an uncomplicated labor course as follows: Membrane Rupture Time/Date: 8:27 AM ,02/16/2018   Intrapartum Procedures: Episiotomy: None [1]                                         Lacerations:  None [1]  Patient had delivery of a Non Viable infant.  Information for the patient's newborn:  Yariana, Hoaglund [[id number]]  Delivery Method: Vaginal, Spontaneous(Filed from Delivery Summary)   02/16/2018  Details of delivery can be found in separate delivery note.  Patient had a routine postpartum course. Patient is discharged home 02/16/18.  Physical exam  Vitals:   02/16/18 1200 02/16/18 1250 02/16/18 1456 02/16/18 1821  BP:  104/67 (!) 100/58 (!) 108/59  Pulse:  (!) 103 (!) 101 95  Resp: 18 18 18 18   Temp: 99.7 F (37.6 C) 98.7 F (37.1 C) 99.5 F (37.5 C) 98.7 F (37.1 C)  TempSrc: Oral Oral Oral Oral  SpO2:      Weight:      Height:       General: alert and cooperative, appropriate grieving Lochia: appropriate Uterine Fundus: firm Incision: N/A DVT Evaluation:  No cords or calf tenderness. Labs: Lab Results  Component Value Date   WBC 11.1 (H) 02/15/2018   HGB 12.0 02/15/2018   HCT 34.4 (L) 02/15/2018   MCV 91.7 02/15/2018   PLT 294 02/15/2018   CMP Latest Ref Rng & Units 12/20/2014  Glucose 70 - 99 mg/dL 97  BUN 6 - 23 mg/dL 10  Creatinine 0.50 - 1.10 mg/dL 0.56  Sodium 135 - 145 mmol/L 134(L)  Potassium 3.5 - 5.1 mmol/L 3.5  Chloride 96 - 112 mmol/L 106  CO2 19 - 32 mmol/L 22  Calcium 8.4 - 10.5 mg/dL 8.8  Total Protein 6.0 - 8.3 g/dL 6.1  Total Bilirubin 0.3 - 1.2 mg/dL 0.4  Alkaline Phos 39 - 117 U/L 182(H)  AST 0 - 37 U/L 23  ALT 0 - 35 U/L 18    Discharge instruction: per After  Visit Summary   After visit meds:  Allergies as of 02/16/2018   No Known Allergies     Medication List    TAKE these medications   benzocaine-Menthol 20-0.5 % Aero Commonly known as:  DERMOPLAST Apply 1 application topically as needed for irritation (perineal discomfort).   ibuprofen 600 MG tablet Commonly known as:  ADVIL,MOTRIN Take 1 tablet (600 mg total) by mouth every 6 (six) hours.   prenatal multivitamin Tabs tablet Take 1 tablet by mouth daily at 12 noon.       Diet: routine diet  Activity: Advance as tolerated. Pelvic rest for 6 weeks.   Outpatient follow up:2 weeks  Postpartum contraception: Not Discussed  Newborn Data: Non-viable female "Terrence Dupont" Birth Weight: 15.3 oz (434 g) APGAR: 0, 0  Newborn Delivery   Birth date/time:  02/16/2018 08:27:00 Delivery type:  Vaginal, Spontaneous    Body of newborn to be cared by [facility]  02/16/2018 Juliene Pina, CNM

## 2018-02-20 ENCOUNTER — Encounter (HOSPITAL_COMMUNITY): Payer: Self-pay | Admitting: *Deleted

## 2018-02-26 NOTE — Anesthesia Postprocedure Evaluation (Signed)
Anesthesia Post Note  Patient: Meagan Torres  Procedure(s) Performed: AN AD [place]     Patient location during evaluation: Mother Baby Anesthesia Type: Epidural Level of consciousness: awake and alert Pain management: pain level controlled Vital Signs Assessment: post-procedure vital signs reviewed and stable Respiratory status: spontaneous breathing, nonlabored ventilation and respiratory function stable Cardiovascular status: stable Postop Assessment: no headache, no backache and epidural receding Anesthetic complications: no      Nolon Nations

## 2018-08-07 NOTE — Progress Notes (Signed)
Subjective:    Patient ID: Meagan Torres, female    DOB: 1986-02-21, 32 y.o.   MRN: [id number]  HPI:  Meagan Torres is here to establish as a new pt.  She is a pleasant 32 year old female.  PMH: Asthma that is very well controlled, she has not needed to use rescue inhaler in years. She suffered stillbirth April 2019 (22 years gestation) She reports excellent support system of family and friends. She and her husband have participated in local support group. She denies acute depression, seldom drinks ETOH She enjoys running but has limited time due to caring for their 3 year son "Meagan Torres"   Patient Care Team    Relationship Specialty Notifications Start End  Esaw Grandchild, NP PCP - General Family Medicine  08/08/18   Dermatology, [facility]    08/08/18   Obgyn, [facility]    08/08/18     Patient Active Problem List   Diagnosis Date Noted  . Healthcare maintenance 08/08/2018  . Labor abnormality 02/15/2018  . Fetal demise, greater than 22 weeks, antepartum, fetus 1 02/15/2018  . Cystic hygroma of fetus in singleton pregnancy 12/28/2017  . Fetal hydrops 12/28/2017  . [redacted] weeks gestation of pregnancy   . Vacuum extraction, delivered, current hospitalization 12/21/2014  . Third degree perineal laceration, delivered, current hospitalization 12/21/2014     Past Medical History:  Diagnosis Date  . Asthma      Past Surgical History:  Procedure Laterality Date  . BASAL CELL CARCINOMA EXCISION    . NO PAST SURGERIES    . WISDOM TOOTH EXTRACTION       Family History  Problem Relation Age of Onset  . Hypertension Mother   . Cancer Father        melanoma  . Cancer Maternal Grandmother        breast  . Hypertension Maternal Grandmother   . Cancer Maternal Grandfather        lung  . Stroke Paternal Grandfather      Social History   Substance and Sexual Activity  Drug Use No     Social History   Substance and Sexual Activity  Alcohol Use Yes   Comment: 1-2 per month      Social History   Tobacco Use  Smoking Status Never Smoker  Smokeless Tobacco Never Used     Outpatient Encounter Medications as of 08/08/2018  Medication Sig  . albuterol (PROAIR HFA) 108 (90 Base) MCG/ACT inhaler Inhale 2 puffs into the lungs every 6 (six) hours as needed.  . Prenatal Vit-Fe Fumarate-FA (PRENATAL MULTIVITAMIN) TABS tablet Take 1 tablet by mouth daily at 12 noon.  . [DISCONTINUED] benzocaine-Menthol (DERMOPLAST) 20-0.5 % AERO Apply 1 application topically as needed for irritation (perineal discomfort).  . [DISCONTINUED] ibuprofen (ADVIL,MOTRIN) 600 MG tablet Take 1 tablet (600 mg total) by mouth every 6 (six) hours.   No facility-administered encounter medications on file as of 08/08/2018.     Allergies: Patient has no known allergies.  Body mass index is 21.17 kg/m.  Blood pressure 113/81, pulse 97, height 5' 1.5" (1.562 m), weight 113 lb 14.4 oz (51.7 kg), last menstrual period 07/17/2018, SpO2 100 %, unknown if currently breastfeeding. Review of Systems  Constitutional: Positive for fatigue. Negative for activity change, appetite change, chills, diaphoresis, fever and unexpected weight change.  HENT: Negative for congestion.   Eyes: Negative for visual disturbance.  Respiratory: Negative for cough, chest tightness, shortness of breath, wheezing and stridor.   Cardiovascular: Negative for  chest pain, palpitations and leg swelling.  Gastrointestinal: Negative for abdominal distention, abdominal pain, blood in stool, constipation, diarrhea, nausea and vomiting.  Endocrine: Negative for cold intolerance, heat intolerance, polydipsia, polyphagia and polyuria.  Genitourinary: Negative for difficulty urinating and flank pain.  Musculoskeletal: Negative for arthralgias, back pain, gait problem, joint swelling, myalgias, neck pain and neck stiffness.  Skin: Negative for color change, pallor, rash and wound.  Neurological: Negative for dizziness and headaches.   Hematological: Does not bruise/bleed easily.  Psychiatric/Behavioral: Negative for agitation, behavioral problems, confusion, decreased concentration, dysphoric mood, hallucinations, self-injury, sleep disturbance and suicidal ideas. The patient is not nervous/anxious and is not hyperactive.        Objective:   Physical Exam  Constitutional: She is oriented to person, place, and time. She appears well-developed and well-nourished. No distress.  HENT:  Head: Normocephalic and atraumatic.  Right Ear: External ear normal.  Left Ear: External ear normal.  Nose: Nose normal.  Mouth/Throat: Oropharynx is clear and moist.  Eyes: Pupils are equal, round, and reactive to light. Conjunctivae and EOM are normal.  Cardiovascular: Normal rate, regular rhythm, normal heart sounds and intact distal pulses.  No murmur heard. Pulmonary/Chest: Effort normal and breath sounds normal. No stridor. No respiratory distress. She has no wheezes. She exhibits no tenderness.  Neurological: She is alert and oriented to person, place, and time.  Skin: Skin is warm and dry. Capillary refill takes less than 2 seconds. No rash noted. She is not diaphoretic. No erythema. No pallor.  Psychiatric: She has a normal mood and affect. Her behavior is normal. Judgment and thought content normal.  Nursing note and vitals reviewed.     Assessment & Plan:   1. Healthcare maintenance     Healthcare maintenance Increase water intake, strive for at least 65 ounces/day.   Follow Mediterranean Diet. Increase regular exercise.  Recommend at least 30 minutes daily, 5 days per week of walking, jogging, biking, swimming, YouTube/Pinterest workout videos. Recommend complete physical in 4 weeks, please schedule fasting lab appt the week prior.    FOLLOW-UP:  Return in about 4 weeks (around 09/05/2018) for Fasting Labs, CPE.

## 2018-08-08 ENCOUNTER — Encounter: Payer: Self-pay | Admitting: Adult Health

## 2018-08-08 ENCOUNTER — Ambulatory Visit (INDEPENDENT_AMBULATORY_CARE_PROVIDER_SITE_OTHER): Payer: 59 | Admitting: Adult Health

## 2018-08-08 VITALS — BP 113/81 | HR 97 | Ht 61.5 in | Wt 113.9 lb

## 2018-08-08 DIAGNOSIS — Z0000 Encounter for general adult medical examination without abnormal findings: Secondary | ICD-10-CM | POA: Diagnosis not present

## 2018-08-08 NOTE — Patient Instructions (Signed)
Mediterranean Diet A Mediterranean diet refers to food and lifestyle choices that are based on the traditions of countries located on the [place]. This way of eating has been shown to help prevent certain conditions and improve outcomes for people who have chronic diseases, like kidney disease and heart disease. What are tips for following this plan? Lifestyle  Cook and eat meals together with your family, when possible.  Drink enough fluid to keep your urine clear or pale yellow.  Be physically active every day. This includes: ? Aerobic exercise like running or swimming. ? Leisure activities like gardening, walking, or housework.  Get 7-8 hours of sleep each night.  If recommended by your health care provider, drink red wine in moderation. This means 1 glass a day for nonpregnant women and 2 glasses a day for men. A glass of wine equals 5 oz (150 mL). Reading food labels  Check the serving size of packaged foods. For foods such as rice and pasta, the serving size refers to the amount of cooked product, not dry.  Check the total fat in packaged foods. Avoid foods that have saturated fat or trans fats.  Check the ingredients list for added sugars, such as corn syrup. Shopping  At the grocery store, buy most of your food from the areas near the walls of the store. This includes: ? Fresh fruits and vegetables (produce). ? Grains, beans, nuts, and seeds. Some of these may be available in unpackaged forms or large amounts (in bulk). ? Fresh seafood. ? Poultry and eggs. ? Low-fat dairy products.  Buy whole ingredients instead of prepackaged foods.  Buy fresh fruits and vegetables in-season from local farmers markets.  Buy frozen fruits and vegetables in resealable bags.  If you do not have access to quality fresh seafood, buy precooked frozen shrimp or canned fish, such as tuna, salmon, or sardines.  Buy small amounts of raw or cooked vegetables, salads, or olives from the  deli or salad bar at your store.  Stock your pantry so you always have certain foods on hand, such as olive oil, canned tuna, canned tomatoes, rice, pasta, and beans. Cooking  Cook foods with extra-virgin olive oil instead of using butter or other vegetable oils.  Have meat as a side dish, and have vegetables or grains as your main dish. This means having meat in small portions or adding small amounts of meat to foods like pasta or stew.  Use beans or vegetables instead of meat in common dishes like chili or lasagna.  Experiment with different cooking methods. Try roasting or broiling vegetables instead of steaming or sauteing them.  Add frozen vegetables to soups, stews, pasta, or rice.  Add nuts or seeds for added healthy fat at each meal. You can add these to yogurt, salads, or vegetable dishes.  Marinate fish or vegetables using olive oil, lemon juice, garlic, and fresh herbs. Meal planning  Plan to eat 1 vegetarian meal one day each week. Try to work up to 2 vegetarian meals, if possible.  Eat seafood 2 or more times a week.  Have healthy snacks readily available, such as: ? Vegetable sticks with hummus. ? Greek yogurt. ? Fruit and nut trail mix.  Eat balanced meals throughout the week. This includes: ? Fruit: 2-3 servings a day ? Vegetables: 4-5 servings a day ? Low-fat dairy: 2 servings a day ? Fish, poultry, or lean meat: 1 serving a day ? Beans and legumes: 2 or more servings a week ? Nuts   and seeds: 1-2 servings a day ? Whole grains: 6-8 servings a day ? Extra-virgin olive oil: 3-4 servings a day  Limit red meat and sweets to only a few servings a month What are my food choices?  Mediterranean diet ? Recommended ? Grains: Whole-grain pasta. Brown rice. Bulgar wheat. Polenta. Couscous. Whole-wheat bread. [place]. ? Vegetables: Artichokes. Beets. Broccoli. Cabbage. Carrots. Eggplant. Green beans. Chard. Kale. Spinach. Onions. Leeks. Peas. Squash.  Tomatoes. Peppers. Radishes. ? Fruits: Apples. Apricots. Avocado. Berries. Bananas. Cherries. Dates. Figs. Grapes. Lemons. Melon. Oranges. Peaches. Plums. Pomegranate. ? Meats and other protein foods: Beans. Almonds. Sunflower seeds. Pine nuts. Peanuts. [place]. Salmon. Scallops. Shrimp. [place]. Tilapia. Clams. Oysters. Eggs. ? Dairy: Low-fat milk. Cheese. Greek yogurt. ? Beverages: Water. Red wine. Herbal tea. ? Fats and oils: Extra virgin olive oil. Avocado oil. Grape seed oil. ? Sweets and desserts: [place] yogurt with honey. Baked apples. Poached pears. Trail mix. ? Seasoning and other foods: Basil. Cilantro. Coriander. Cumin. Mint. Parsley. Sage. Rosemary. Tarragon. Garlic. Oregano. Thyme. Pepper. Balsalmic vinegar. Tahini. Hummus. Tomato sauce. Olives. Mushrooms. ? Limit these ? Grains: Prepackaged pasta or rice dishes. Prepackaged cereal with added sugar. ? Vegetables: Deep fried potatoes (french fries). ? Fruits: Fruit canned in syrup. ? Meats and other protein foods: Beef. Pork. Lamb. Poultry with skin. Hot dogs. Bernie[place] Salines. ? Dairy: Ice cream. Sour cream. Whole milk. ? Beverages: Juice. Sugar-sweetened soft drinks. Beer. Liquor and spirits. ? Fats and oils: Butter. Canola oil. Vegetable oil. Beef fat (tallow). Lard. ? Sweets and desserts: Cookies. Cakes. Pies. Candy. ? Seasoning and other foods: Mayonnaise. Premade sauces and marinades. ? The items listed may not be a complete list. Talk with your dietitian about what dietary choices are right for you. Summary  The Mediterranean diet includes both food and lifestyle choices.  Eat a variety of fresh fruits and vegetables, beans, nuts, seeds, and whole grains.  Limit the amount of red meat and sweets that you eat.  Talk with your health care provider about whether it is safe for you to drink red wine in moderation. This means 1 glass a day for nonpregnant women and 2 glasses a day for men. A glass of wine equals 5 oz (150 mL). This information  is not intended to replace advice given to you by your health care provider. Make sure you discuss any questions you have with your health care provider. Document Released: 06/02/2016 Document Revised: 07/05/2016 Document Reviewed: 06/02/2016 Elsevier Interactive Patient Education  2018 [employer].   Increase water intake, strive for at least 65 ounces/day.   Follow Mediterranean Diet. Increase regular exercise.  Recommend at least 30 minutes daily, 5 days per week of walking, jogging, biking, swimming, YouTube/Pinterest workout videos. Recommend complete physical in 4 weeks, please schedule fasting lab appt the week prior. WELCOME TO THE PRACTICE!

## 2018-08-08 NOTE — Assessment & Plan Note (Signed)
Increase water intake, strive for at least 65 ounces/day.   Follow Mediterranean Diet. Increase regular exercise.  Recommend at least 30 minutes daily, 5 days per week of walking, jogging, biking, swimming, YouTube/Pinterest workout videos. Recommend complete physical in 4 weeks, please schedule fasting lab appt the week prior.

## 2018-08-24 NOTE — Progress Notes (Signed)
Opened in error. T. Jiovanna Frei, CMA 

## 2018-09-05 ENCOUNTER — Other Ambulatory Visit: Payer: 59

## 2018-09-05 DIAGNOSIS — Z0000 Encounter for general adult medical examination without abnormal findings: Secondary | ICD-10-CM | POA: Diagnosis not present

## 2018-09-06 ENCOUNTER — Encounter: Payer: Self-pay | Admitting: Adult Health

## 2018-09-06 LAB — CBC WITH DIFFERENTIAL/PLATELET
BASOS: 1 %
Basophils Absolute: 0.1 10*3/uL (ref 0.0–0.2)
EOS (ABSOLUTE): 0.5 10*3/uL — ABNORMAL HIGH (ref 0.0–0.4)
Eos: 7 %
Hematocrit: 40.9 % (ref 34.0–46.6)
Hemoglobin: 13.9 g/dL (ref 11.1–15.9)
IMMATURE GRANS (ABS): 0 10*3/uL (ref 0.0–0.1)
Immature Granulocytes: 0 %
LYMPHS ABS: 2.4 10*3/uL (ref 0.7–3.1)
Lymphs: 36 %
MCH: 30.5 pg (ref 26.6–33.0)
MCHC: 34 g/dL (ref 31.5–35.7)
MCV: 90 fL (ref 79–97)
Monocytes Absolute: 0.6 10*3/uL (ref 0.1–0.9)
Monocytes: 9 %
NEUTROS ABS: 3.1 10*3/uL (ref 1.4–7.0)
Neutrophils: 47 %
PLATELETS: 289 10*3/uL (ref 150–450)
RBC: 4.56 x10E6/uL (ref 3.77–5.28)
RDW: 11.8 % — ABNORMAL LOW (ref 12.3–15.4)
WBC: 6.7 10*3/uL (ref 3.4–10.8)

## 2018-09-06 LAB — LIPID PANEL
CHOL/HDL RATIO: 2.5 ratio (ref 0.0–4.4)
Cholesterol, Total: 148 mg/dL (ref 100–199)
HDL: 59 mg/dL (ref >39)
LDL CALC: 76 mg/dL (ref 0–99)
Triglycerides: 63 mg/dL (ref 0–149)
VLDL CHOLESTEROL CAL: 13 mg/dL (ref 5–40)

## 2018-09-06 LAB — COMPREHENSIVE METABOLIC PANEL
A/G RATIO: 1.6 (ref 1.2–2.2)
ALBUMIN: 4.6 g/dL (ref 3.5–5.5)
ALK PHOS: 57 IU/L (ref 39–117)
ALT: 12 IU/L (ref 0–32)
AST: 13 IU/L (ref 0–40)
BILIRUBIN TOTAL: 0.6 mg/dL (ref 0.0–1.2)
BUN / CREAT RATIO: 16 (ref 9–23)
BUN: 14 mg/dL (ref 6–20)
CHLORIDE: 104 mmol/L (ref 96–106)
CO2: 20 mmol/L (ref 20–29)
Calcium: 9.3 mg/dL (ref 8.7–10.2)
Creatinine, Ser: 0.87 mg/dL (ref 0.57–1.00)
GFR calc Af Amer: 104 mL/min/{1.73_m2} (ref >59)
GFR calc non Af Amer: 90 mL/min/{1.73_m2} (ref >59)
GLUCOSE: 82 mg/dL (ref 65–99)
Globulin, Total: 2.8 g/dL (ref 1.5–4.5)
POTASSIUM: 4.4 mmol/L (ref 3.5–5.2)
Sodium: 139 mmol/L (ref 134–144)
Total Protein: 7.4 g/dL (ref 6.0–8.5)

## 2018-09-06 LAB — HEMOGLOBIN A1C
ESTIMATED AVERAGE GLUCOSE: 91 mg/dL
Hgb A1c MFr Bld: 4.8 % (ref 4.8–5.6)

## 2018-09-06 LAB — TSH: TSH: 1.3 u[IU]/mL (ref 0.450–4.500)

## 2018-09-10 NOTE — Progress Notes (Signed)
Subjective:    Patient ID: Meagan Torres, female    DOB: 06-Jun-1986, 32 y.o.   MRN: [id number]  HPI: 08/08/18 OV:  [name] is here to establish as a new pt.  She is a pleasant 32 year old female.  PMH: Asthma that is very well controlled, she has not needed to use rescue inhaler in years. She suffered stillbirth April 2019 (22 years gestation) She reports excellent support system of family and friends. She and her husband have participated in local support group. She denies acute depression, seldom drinks ETOH She enjoys running but has limited time due to caring for their 3 year son "Meagan Torres"  09/12/18 OV: [name] is here for CPE She continues to struggle with stillbirth, declined referral to [place] at this time but may be open to establishing with therapist after the New Year. She denies thoughts of harming herself/others She has been walking 3 times week, >30 mins per session. Reviewed recent labs- overall stable  Healthcare Maintenance: PAP-UTD Mammogram-N/A Immunizations-UTD   Patient Care Team    Relationship Specialty Notifications Start End  Esaw Grandchild, NP PCP - General Family Medicine  08/08/18   Dermatology, [facility]    08/08/18   Obgyn, [facility]    08/08/18     Patient Active Problem List   Diagnosis Date Noted  . Healthcare maintenance 08/08/2018  . Labor abnormality 02/15/2018  . Fetal demise, greater than 22 weeks, antepartum, fetus 1 02/15/2018  . Cystic hygroma of fetus in singleton pregnancy 12/28/2017  . Fetal hydrops 12/28/2017  . [redacted] weeks gestation of pregnancy   . Vacuum extraction, delivered, current hospitalization 12/21/2014  . Third degree perineal laceration, delivered, current hospitalization 12/21/2014     Past Medical History:  Diagnosis Date  . Asthma      Past Surgical History:  Procedure Laterality Date  . BASAL CELL CARCINOMA EXCISION    . NO PAST SURGERIES    . WISDOM TOOTH EXTRACTION       Family  History  Problem Relation Age of Onset  . Hypertension Mother   . Cancer Father        melanoma  . Cancer Maternal Grandmother        breast  . Hypertension Maternal Grandmother   . Cancer Maternal Grandfather        lung  . Stroke Paternal Grandfather      Social History   Substance and Sexual Activity  Drug Use No     Social History   Substance and Sexual Activity  Alcohol Use Yes   Comment: 1-2 per month     Social History   Tobacco Use  Smoking Status Never Smoker  Smokeless Tobacco Never Used     Outpatient Encounter Medications as of 09/12/2018  Medication Sig  . albuterol (PROAIR HFA) 108 (90 Base) MCG/ACT inhaler Inhale 2 puffs into the lungs every 6 (six) hours as needed.  . Prenatal Vit-Fe Fumarate-FA (PRENATAL MULTIVITAMIN) TABS tablet Take 1 tablet by mouth daily at 12 noon.   No facility-administered encounter medications on file as of 09/12/2018.     Allergies: Patient has no known allergies.  Body mass index is 21.3 kg/m.  Blood pressure 118/74, pulse 75, temperature 98.5 F (36.9 C), temperature source Oral, height 5' 1.5" (1.562 m), weight 114 lb 9.6 oz (52 kg), last menstrual period 08/15/2018, SpO2 100 %, not currently breastfeeding. Review of Systems  Constitutional: Positive for fatigue. Negative for activity change, appetite change, chills, diaphoresis, fever  and unexpected weight change.  HENT: Negative for congestion.   Eyes: Negative for visual disturbance.  Respiratory: Negative for cough, chest tightness, shortness of breath, wheezing and stridor.   Cardiovascular: Negative for chest pain, palpitations and leg swelling.  Gastrointestinal: Negative for abdominal distention, abdominal pain, blood in stool, constipation, diarrhea, nausea and vomiting.  Endocrine: Negative for cold intolerance, heat intolerance, polydipsia, polyphagia and polyuria.  Genitourinary: Negative for difficulty urinating and flank pain.  Musculoskeletal:  Negative for arthralgias, back pain, gait problem, joint swelling, myalgias, neck pain and neck stiffness.  Skin: Negative for color change, pallor, rash and wound.  Neurological: Negative for dizziness and headaches.  Hematological: Does not bruise/bleed easily.  Psychiatric/Behavioral: Positive for dysphoric mood. Negative for agitation, behavioral problems, confusion, decreased concentration, hallucinations, self-injury, sleep disturbance and suicidal ideas. The patient is not nervous/anxious and is not hyperactive.        Objective:   Physical Exam  Constitutional: She is oriented to person, place, and time. She appears well-developed and well-nourished. No distress.  HENT:  Head: Normocephalic and atraumatic.  Right Ear: External ear normal.  Left Ear: External ear normal.  Nose: Nose normal.  Mouth/Throat: Oropharynx is clear and moist.  Eyes: Pupils are equal, round, and reactive to light. Conjunctivae and EOM are normal.  Neck: Normal range of motion. Neck supple.  Cardiovascular: Normal rate, regular rhythm, normal heart sounds and intact distal pulses.  No murmur heard. Pulmonary/Chest: Effort normal and breath sounds normal. No stridor. No respiratory distress. She has no wheezes. She exhibits no tenderness.  Abdominal: Soft. Bowel sounds are normal. She exhibits no distension and no mass. There is no tenderness. There is no rebound and no guarding. No hernia.  Musculoskeletal: Normal range of motion. She exhibits no edema or tenderness.  Lymphadenopathy:    She has no cervical adenopathy.  Neurological: She is alert and oriented to person, place, and time. Coordination normal.  Skin: Skin is warm and dry. Capillary refill takes less than 2 seconds. No rash noted. She is not diaphoretic. No erythema. No pallor.  Psychiatric: She has a normal mood and affect. Her behavior is normal. Judgment and thought content normal.  Nursing note and vitals reviewed.     Assessment &  Plan:   1. Need for influenza vaccination   2. Healthcare maintenance     Healthcare maintenance Overall you are doing a GREAT JOB taking care of yourself! Continue to drink plenty of water and follow heart healthy diet. Recent labs look good! Recommend annual physical with fasting labs. If you would ever like a referral to [facility], please call and we will place a referral.   FOLLOW-UP:  Return in about 1 year (around 09/13/2019) for Fasting Labs, CPE.

## 2018-09-12 ENCOUNTER — Encounter: Payer: Self-pay | Admitting: Adult Health

## 2018-09-12 ENCOUNTER — Ambulatory Visit (INDEPENDENT_AMBULATORY_CARE_PROVIDER_SITE_OTHER): Payer: 59 | Admitting: Adult Health

## 2018-09-12 VITALS — BP 118/74 | HR 75 | Temp 98.5°F | Ht 61.5 in | Wt 114.6 lb

## 2018-09-12 DIAGNOSIS — Z23 Encounter for immunization: Secondary | ICD-10-CM

## 2018-09-12 DIAGNOSIS — Z0000 Encounter for general adult medical examination without abnormal findings: Secondary | ICD-10-CM

## 2018-09-12 NOTE — Assessment & Plan Note (Signed)
Overall you are doing a GREAT JOB taking care of yourself! Continue to drink plenty of water and follow heart healthy diet. Recent labs look good! Recommend annual physical with fasting labs. If you would ever like a referral to [facility], please call and we will place a referral.

## 2018-09-12 NOTE — Patient Instructions (Addendum)
Preventive Care for Adults, Female  A healthy lifestyle and preventive care can promote health and wellness. Preventive health guidelines for women include the following key practices.   A routine yearly physical is a good way to check with your health care provider about your health and preventive screening. It is a chance to share any concerns and updates on your health and to receive a thorough exam.   Visit your dentist for a routine exam and preventive care every 6 months. Brush your teeth twice a day and floss once a day. Good oral hygiene prevents tooth decay and gum disease.   The frequency of eye exams is based on your age, health, family medical history, use of contact lenses, and other factors. Follow your health care provider's recommendations for frequency of eye exams.   Eat a healthy diet. Foods like vegetables, fruits, whole grains, low-fat dairy products, and lean protein foods contain the nutrients you need without too many calories. Decrease your intake of foods high in solid fats, added sugars, and salt. Eat the right amount of calories for you.Get information about a proper diet from your health care provider, if necessary.   Regular physical exercise is one of the most important things you can do for your health. Most adults should get at least 150 minutes of moderate-intensity exercise (any activity that increases your heart rate and causes you to sweat) each week. In addition, most adults need muscle-strengthening exercises on 2 or more days a week.   Maintain a healthy weight. The body mass index (BMI) is a screening tool to identify possible weight problems. It provides an estimate of body fat based on height and weight. Your health care provider can find your BMI, and can help you achieve or maintain a healthy weight.For adults 20 years and older:   - A BMI below 18.5 is considered underweight.   - A BMI of 18.5 to 24.9 is normal.   - A BMI of 25 to 29.9 is  considered overweight.   - A BMI of 30 and above is considered obese.   Maintain normal blood lipids and cholesterol levels by exercising and minimizing your intake of trans and saturated fats.  Eat a balanced diet with plenty of fruit and vegetables. Blood tests for lipids and cholesterol should begin at age 20 and be repeated every 5 years minimum.  If your lipid or cholesterol levels are high, you are over 40, or you are at high risk for heart disease, you may need your cholesterol levels checked more frequently.Ongoing high lipid and cholesterol levels should be treated with medicines if diet and exercise are not working.   If you smoke, find out from your health care provider how to quit. If you do not use tobacco, do not start.   Lung cancer screening is recommended for adults aged 55-80 years who are at high risk for developing lung cancer because of a history of smoking. A yearly low-dose CT scan of the lungs is recommended for people who have at least a 30-pack-year history of smoking and are a current smoker or have quit within the past 15 years. A pack year of smoking is smoking an average of 1 pack of cigarettes a day for 1 year (for example: 1 pack a day for 30 years or 2 packs a day for 15 years). Yearly screening should continue until the smoker has stopped smoking for at least 15 years. Yearly screening should be stopped for people who develop a   health problem that would prevent them from having lung cancer treatment.   If you are pregnant, do not drink alcohol. If you are breastfeeding, be very cautious about drinking alcohol. If you are not pregnant and choose to drink alcohol, do not have more than 1 drink per day. One drink is considered to be 12 ounces (355 mL) of beer, 5 ounces (148 mL) of wine, or 1.5 ounces (44 mL) of liquor.   Avoid use of street drugs. Do not share needles with anyone. Ask for help if you need support or instructions about stopping the use of  drugs.   High blood pressure causes heart disease and increases the risk of stroke. Your blood pressure should be checked at least yearly.  Ongoing high blood pressure should be treated with medicines if weight loss and exercise do not work.   If you are 69-55 years old, ask your health care provider if you should take aspirin to prevent strokes.   Diabetes screening involves taking a blood sample to check your fasting blood sugar level. This should be done once every 3 years, after age 38, if you are within normal weight and without risk factors for diabetes. Testing should be considered at a younger age or be carried out more frequently if you are overweight and have at least 1 risk factor for diabetes.   Breast cancer screening is essential preventive care for women. You should practice "breast self-awareness."  This means understanding the normal appearance and feel of your breasts and may include breast self-examination.  Any changes detected, no matter how small, should be reported to a health care provider.  Women in their 80s and 30s should have a clinical breast exam (CBE) by a health care provider as part of a regular health exam every 1 to 3 years.  After age 66, women should have a CBE every year.  Starting at age 1, women should consider having a mammogram (breast X-ray test) every year.  Women who have a family history of breast cancer should talk to their health care provider about genetic screening.  Women at a high risk of breast cancer should talk to their health care providers about having an MRI and a mammogram every year.   -Breast cancer gene (BRCA)-related cancer risk assessment is recommended for women who have family members with BRCA-related cancers. BRCA-related cancers include breast, ovarian, tubal, and peritoneal cancers. Having family members with these cancers may be associated with an increased risk for harmful changes (mutations) in the breast cancer genes BRCA1 and  BRCA2. Results of the assessment will determine the need for genetic counseling and BRCA1 and BRCA2 testing.   The Pap test is a screening test for cervical cancer. A Pap test can show cell changes on the cervix that might become cervical cancer if left untreated. A Pap test is a procedure in which cells are obtained and examined from the lower end of the uterus (cervix).   - Women should have a Pap test starting at age 57.   - Between ages 90 and 70, Pap tests should be repeated every 2 years.   - Beginning at age 63, you should have a Pap test every 3 years as long as the past 3 Pap tests have been normal.   - Some women have medical problems that increase the chance of getting cervical cancer. Talk to your health care provider about these problems. It is especially important to talk to your health care provider if a  new problem develops soon after your last Pap test. In these cases, your health care provider may recommend more frequent screening and Pap tests.   - The above recommendations are the same for women who have or have not gotten the vaccine for human papillomavirus (HPV).   - If you had a hysterectomy for a problem that was not cancer or a condition that could lead to cancer, then you no longer need Pap tests. Even if you no longer need a Pap test, a regular exam is a good idea to make sure no other problems are starting.   - If you are between ages 36 and 66 years, and you have had normal Pap tests going back 10 years, you no longer need Pap tests. Even if you no longer need a Pap test, a regular exam is a good idea to make sure no other problems are starting.   - If you have had past treatment for cervical cancer or a condition that could lead to cancer, you need Pap tests and screening for cancer for at least 20 years after your treatment.   - If Pap tests have been discontinued, risk factors (such as a new sexual partner) need to be reassessed to determine if screening should  be resumed.   - The HPV test is an additional test that may be used for cervical cancer screening. The HPV test looks for the virus that can cause the cell changes on the cervix. The cells collected during the Pap test can be tested for HPV. The HPV test could be used to screen women aged 70 years and older, and should be used in women of any age who have unclear Pap test results. After the age of 67, women should have HPV testing at the same frequency as a Pap test.   Colorectal cancer can be detected and often prevented. Most routine colorectal cancer screening begins at the age of 57 years and continues through age 26 years. However, your health care provider may recommend screening at an earlier age if you have risk factors for colon cancer. On a yearly basis, your health care provider may provide home test kits to check for hidden blood in the stool.  Use of a small camera at the end of a tube, to directly examine the colon (sigmoidoscopy or colonoscopy), can detect the earliest forms of colorectal cancer. Talk to your health care provider about this at age 23, when routine screening begins. Direct exam of the colon should be repeated every 5 -10 years through age 49 years, unless early forms of pre-cancerous polyps or small growths are found.   People who are at an increased risk for hepatitis B should be screened for this virus. You are considered at high risk for hepatitis B if:  -You were born in a country where hepatitis B occurs often. Talk with your health care provider about which countries are considered high risk.  - Your parents were born in a high-risk country and you have not received a shot to protect against hepatitis B (hepatitis B vaccine).  - You have HIV or AIDS.  - You use needles to inject street drugs.  - You live with, or have sex with, someone who has Hepatitis B.  - You get hemodialysis treatment.  - You take certain medicines for conditions like cancer, organ  transplantation, and autoimmune conditions.   Hepatitis C blood testing is recommended for all people born from [birth year] through 1965 and any individual  with known risks for hepatitis C.   Practice safe sex. Use condoms and avoid high-risk sexual practices to reduce the spread of sexually transmitted infections (STIs). STIs include gonorrhea, chlamydia, syphilis, trichomonas, herpes, HPV, and human immunodeficiency virus (HIV). Herpes, HIV, and HPV are viral illnesses that have no cure. They can result in disability, cancer, and death. Sexually active women aged 25 years and younger should be checked for chlamydia. Older women with new or multiple partners should also be tested for chlamydia. Testing for other STIs is recommended if you are sexually active and at increased risk.   Osteoporosis is a disease in which the bones lose minerals and strength with aging. This can result in serious bone fractures or breaks. The risk of osteoporosis can be identified using a bone density scan. Women ages 65 years and over and women at risk for fractures or osteoporosis should discuss screening with their health care providers. Ask your health care provider whether you should take a calcium supplement or vitamin D to There are also several preventive steps women can take to avoid osteoporosis and resulting fractures or to keep osteoporosis from worsening. -->Recommendations include:  Eat a balanced diet high in fruits, vegetables, calcium, and vitamins.  Get enough calcium. The recommended total intake of is 1,200 mg daily; for best absorption, if taking supplements, divide doses into 250-500 mg doses throughout the day. Of the two types of calcium, calcium carbonate is best absorbed when taken with food but calcium citrate can be taken on an empty stomach.  Get enough vitamin D. NAMS and the National Osteoporosis Foundation recommend at least 1,000 IU per day for women age 50 and over who are at risk of vitamin D  deficiency. Vitamin D deficiency can be caused by inadequate sun exposure (for example, those who live in northern latitudes).  Avoid alcohol and smoking. Heavy alcohol intake (more than 7 drinks per week) increases the risk of falls and hip fracture and women smokers tend to lose bone more rapidly and have lower bone mass than nonsmokers. Stopping smoking is one of the most important changes women can make to improve their health and decrease risk for disease.  Be physically active every day. Weight-bearing exercise (for example, fast walking, hiking, jogging, and weight training) may strengthen bones or slow the rate of bone loss that comes with aging. Balancing and muscle-strengthening exercises can reduce the risk of falling and fracture.  Consider therapeutic medications. Currently, several types of effective drugs are available. Healthcare providers can recommend the type most appropriate for each woman.  Eliminate environmental factors that may contribute to accidents. Falls cause nearly 90% of all osteoporotic fractures, so reducing this risk is an important bone-health strategy. Measures include ample lighting, removing obstructions to walking, using nonskid rugs on floors, and placing mats and/or grab bars in showers.  Be aware of medication side effects. Some common medicines make bones weaker. These include a type of steroid drug called glucocorticoids used for arthritis and asthma, some antiseizure drugs, certain sleeping pills, treatments for endometriosis, and some cancer drugs. An overactive thyroid gland or using too much thyroid hormone for an underactive thyroid can also be a problem. If you are taking these medicines, talk to your doctor about what you can do to help protect your bones.reduce the rate of osteoporosis.    Menopause can be associated with physical symptoms and risks. Hormone replacement therapy is available to decrease symptoms and risks. You should talk to your  health care provider   about whether hormone replacement therapy is right for you.   Use sunscreen. Apply sunscreen liberally and repeatedly throughout the day. You should seek shade when your shadow is shorter than you. Protect yourself by wearing long sleeves, pants, a wide-brimmed hat, and sunglasses year round, whenever you are outdoors.   Once a month, do a whole body skin exam, using a mirror to look at the skin on your back. Tell your health care provider of new moles, moles that have irregular borders, moles that are larger than a pencil eraser, or moles that have changed in shape or color.   -Stay current with required vaccines (immunizations).   Influenza vaccine. All adults should be immunized every year.  Tetanus, diphtheria, and acellular pertussis (Td, Tdap) vaccine. Pregnant women should receive 1 dose of Tdap vaccine during each pregnancy. The dose should be obtained regardless of the length of time since the last dose. Immunization is preferred during the 27th 36th week of gestation. An adult who has not previously received Tdap or who does not know her vaccine status should receive 1 dose of Tdap. This initial dose should be followed by tetanus and diphtheria toxoids (Td) booster doses every 10 years. Adults with an unknown or incomplete history of completing a 3-dose immunization series with Td-containing vaccines should begin or complete a primary immunization series including a Tdap dose. Adults should receive a Td booster every 10 years.  Varicella vaccine. An adult without evidence of immunity to varicella should receive 2 doses or a second dose if she has previously received 1 dose. Pregnant females who do not have evidence of immunity should receive the first dose after pregnancy. This first dose should be obtained before leaving the health care facility. The second dose should be obtained 4 8 weeks after the first dose.  Human papillomavirus (HPV) vaccine. Females aged 13 26  years who have not received the vaccine previously should obtain the 3-dose series. The vaccine is not recommended for use in pregnant females. However, pregnancy testing is not needed before receiving a dose. If a female is found to be pregnant after receiving a dose, no treatment is needed. In that case, the remaining doses should be delayed until after the pregnancy. Immunization is recommended for any person with an immunocompromised condition through the age of 26 years if she did not get any or all doses earlier. During the 3-dose series, the second dose should be obtained 4 8 weeks after the first dose. The third dose should be obtained 24 weeks after the first dose and 16 weeks after the second dose.  Zoster vaccine. One dose is recommended for adults aged 60 years or older unless certain conditions are present.  Measles, mumps, and rubella (MMR) vaccine. Adults born before 1957 generally are considered immune to measles and mumps. Adults born in 1957 or later should have 1 or more doses of MMR vaccine unless there is a contraindication to the vaccine or there is laboratory evidence of immunity to each of the three diseases. A routine second dose of MMR vaccine should be obtained at least 28 days after the first dose for students attending postsecondary schools, health care workers, or international travelers. People who received inactivated measles vaccine or an unknown type of measles vaccine during 1963 1967 should receive 2 doses of MMR vaccine. People who received inactivated mumps vaccine or an unknown type of mumps vaccine before 1979 and are at high risk for mumps infection should consider immunization with 2 doses of   MMR vaccine. For females of childbearing age, rubella immunity should be determined. If there is no evidence of immunity, females who are not pregnant should be vaccinated. If there is no evidence of immunity, females who are pregnant should delay immunization until after pregnancy.  Unvaccinated health care workers born before [birth year] who lack laboratory evidence of measles, mumps, or rubella immunity or laboratory confirmation of disease should consider measles and mumps immunization with 2 doses of MMR vaccine or rubella immunization with 1 dose of MMR vaccine.  Pneumococcal 13-valent conjugate (PCV13) vaccine. When indicated, a person who is uncertain of her immunization history and has no record of immunization should receive the PCV13 vaccine. An adult aged 54 years or older who has certain medical conditions and has not been previously immunized should receive 1 dose of PCV13 vaccine. This PCV13 should be followed with a dose of pneumococcal polysaccharide (PPSV23) vaccine. The PPSV23 vaccine dose should be obtained at least 8 weeks after the dose of PCV13 vaccine. An adult aged 58 years or older who has certain medical conditions and previously received 1 or more doses of PPSV23 vaccine should receive 1 dose of PCV13. The PCV13 vaccine dose should be obtained 1 or more years after the last PPSV23 vaccine dose.  Pneumococcal polysaccharide (PPSV23) vaccine. When PCV13 is also indicated, PCV13 should be obtained first. All adults aged 58 years and older should be immunized. An adult younger than age 65 years who has certain medical conditions should be immunized. Any person who resides in a nursing home or long-term care facility should be immunized. An adult smoker should be immunized. People with an immunocompromised condition and certain other conditions should receive both PCV13 and PPSV23 vaccines. People with human immunodeficiency virus (HIV) infection should be immunized as soon as possible after diagnosis. Immunization during chemotherapy or radiation therapy should be avoided. Routine use of PPSV23 vaccine is not recommended for American Indians, [place] Natives, or people younger than 65 years unless there are medical conditions that require PPSV23 vaccine. When indicated,  people who have unknown immunization and have no record of immunization should receive PPSV23 vaccine. One-time revaccination 5 years after the first dose of PPSV23 is recommended for people aged 70 64 years who have chronic kidney failure, nephrotic syndrome, asplenia, or immunocompromised conditions. People who received 1 2 doses of PPSV23 before age 32 years should receive another dose of PPSV23 vaccine at age 96 years or later if at least 5 years have passed since the previous dose. Doses of PPSV23 are not needed for people immunized with PPSV23 at or after age 55 years.  Meningococcal vaccine. Adults with asplenia or persistent complement component deficiencies should receive 2 doses of quadrivalent meningococcal conjugate (MenACWY-D) vaccine. The doses should be obtained at least 2 months apart. Microbiologists working with certain meningococcal bacteria, [employer] recruits, people at risk during an outbreak, and people who travel to or live in countries with a high rate of meningitis should be immunized. A first-year college student up through age 58 years who is living in a residence hall should receive a dose if she did not receive a dose on or after her 16th birthday. Adults who have certain high-risk conditions should receive one or more doses of vaccine.  Hepatitis A vaccine. Adults who wish to be protected from this disease, have certain high-risk conditions, work with hepatitis A-infected animals, work in hepatitis A research labs, or travel to or work in countries with a high rate of hepatitis A should be  immunized. Adults who were previously unvaccinated and who anticipate close contact with an international adoptee during the first 60 days after arrival in the [place] from a country with a high rate of hepatitis A should be immunized.  Hepatitis B vaccine.  Adults who wish to be protected from this disease, have certain high-risk conditions, may be exposed to blood or other infectious  body fluids, are household contacts or sex partners of hepatitis B positive people, are clients or workers in certain care facilities, or travel to or work in countries with a high rate of hepatitis B should be immunized.  Haemophilus influenzae type b (Hib) vaccine. A previously unvaccinated person with asplenia or sickle cell disease or having a scheduled splenectomy should receive 1 dose of Hib vaccine. Regardless of previous immunization, a recipient of a hematopoietic stem cell transplant should receive a 3-dose series 6 12 months after her successful transplant. Hib vaccine is not recommended for adults with HIV infection.  Preventive Services / Frequency Ages 6 to 39years  Blood pressure check.** / Every 1 to 2 years.  Lipid and cholesterol check.** / Every 5 years beginning at age 39.  Clinical breast exam.** / Every 3 years for women in their 61s and 62s.  BRCA-related cancer risk assessment.** / For women who have family members with a BRCA-related cancer (breast, ovarian, tubal, or peritoneal cancers).  Pap test.** / Every 2 years from ages 47 through 85. Every 3 years starting at age 34 through age 12 or 74 with a history of 3 consecutive normal Pap tests.  HPV screening.** / Every 3 years from ages 46 through ages 43 to 54 with a history of 3 consecutive normal Pap tests.  Hepatitis C blood test.** / For any individual with known risks for hepatitis C.  Skin self-exam. / Monthly.  Influenza vaccine. / Every year.  Tetanus, diphtheria, and acellular pertussis (Tdap, Td) vaccine.** / Consult your health care provider. Pregnant women should receive 1 dose of Tdap vaccine during each pregnancy. 1 dose of Td every 10 years.  Varicella vaccine.** / Consult your health care provider. Pregnant females who do not have evidence of immunity should receive the first dose after pregnancy.  HPV vaccine. / 3 doses over 6 months, if 64 and younger. The vaccine is not recommended for use in  pregnant females. However, pregnancy testing is not needed before receiving a dose.  Measles, mumps, rubella (MMR) vaccine.** / You need at least 1 dose of MMR if you were born in 1957 or later. You may also need a 2nd dose. For females of childbearing age, rubella immunity should be determined. If there is no evidence of immunity, females who are not pregnant should be vaccinated. If there is no evidence of immunity, females who are pregnant should delay immunization until after pregnancy.  Pneumococcal 13-valent conjugate (PCV13) vaccine.** / Consult your health care provider.  Pneumococcal polysaccharide (PPSV23) vaccine.** / 1 to 2 doses if you smoke cigarettes or if you have certain conditions.  Meningococcal vaccine.** / 1 dose if you are age 71 to 37 years and a Market researcher living in a residence hall, or have one of several medical conditions, you need to get vaccinated against meningococcal disease. You may also need additional booster doses.  Hepatitis A vaccine.** / Consult your health care provider.  Hepatitis B vaccine.** / Consult your health care provider.  Haemophilus influenzae type b (Hib) vaccine.** / Consult your health care provider.  Ages 55 to 64years  Blood pressure check.** / Every 1 to 2 years.  Lipid and cholesterol check.** / Every 5 years beginning at age 20 years.  Lung cancer screening. / Every year if you are aged 55 80 years and have a 30-pack-year history of smoking and currently smoke or have quit within the past 15 years. Yearly screening is stopped once you have quit smoking for at least 15 years or develop a health problem that would prevent you from having lung cancer treatment.  Clinical breast exam.** / Every year after age 40 years.  BRCA-related cancer risk assessment.** / For women who have family members with a BRCA-related cancer (breast, ovarian, tubal, or peritoneal cancers).  Mammogram.** / Every year beginning at age 40  years and continuing for as long as you are in good health. Consult with your health care provider.  Pap test.** / Every 3 years starting at age 30 years through age 65 or 70 years with a history of 3 consecutive normal Pap tests.  HPV screening.** / Every 3 years from ages 30 years through ages 65 to 70 years with a history of 3 consecutive normal Pap tests.  Fecal occult blood test (FOBT) of stool. / Every year beginning at age 50 years and continuing until age 75 years. You may not need to do this test if you get a colonoscopy every 10 years.  Flexible sigmoidoscopy or colonoscopy.** / Every 5 years for a flexible sigmoidoscopy or every 10 years for a colonoscopy beginning at age 50 years and continuing until age 75 years.  Hepatitis C blood test.** / For all people born from 1945 through 1965 and any individual with known risks for hepatitis C.  Skin self-exam. / Monthly.  Influenza vaccine. / Every year.  Tetanus, diphtheria, and acellular pertussis (Tdap/Td) vaccine.** / Consult your health care provider. Pregnant women should receive 1 dose of Tdap vaccine during each pregnancy. 1 dose of Td every 10 years.  Varicella vaccine.** / Consult your health care provider. Pregnant females who do not have evidence of immunity should receive the first dose after pregnancy.  Zoster vaccine.** / 1 dose for adults aged 60 years or older.  Measles, mumps, rubella (MMR) vaccine.** / You need at least 1 dose of MMR if you were born in 1957 or later. You may also need a 2nd dose. For females of childbearing age, rubella immunity should be determined. If there is no evidence of immunity, females who are not pregnant should be vaccinated. If there is no evidence of immunity, females who are pregnant should delay immunization until after pregnancy.  Pneumococcal 13-valent conjugate (PCV13) vaccine.** / Consult your health care provider.  Pneumococcal polysaccharide (PPSV23) vaccine.** / 1 to 2 doses if  you smoke cigarettes or if you have certain conditions.  Meningococcal vaccine.** / Consult your health care provider.  Hepatitis A vaccine.** / Consult your health care provider.  Hepatitis B vaccine.** / Consult your health care provider.  Haemophilus influenzae type b (Hib) vaccine.** / Consult your health care provider.  Ages 65 years and over  Blood pressure check.** / Every 1 to 2 years.  Lipid and cholesterol check.** / Every 5 years beginning at age 20 years.  Lung cancer screening. / Every year if you are aged 55 80 years and have a 30-pack-year history of smoking and currently smoke or have quit within the past 15 years. Yearly screening is stopped once you have quit smoking for at least 15 years or develop a health problem that   would prevent you from having lung cancer treatment.  Clinical breast exam.** / Every year after age 103 years.  BRCA-related cancer risk assessment.** / For women who have family members with a BRCA-related cancer (breast, ovarian, tubal, or peritoneal cancers).  Mammogram.** / Every year beginning at age 36 years and continuing for as long as you are in good health. Consult with your health care provider.  Pap test.** / Every 3 years starting at age 5 years through age 85 or 10 years with 3 consecutive normal Pap tests. Testing can be stopped between 65 and 70 years with 3 consecutive normal Pap tests and no abnormal Pap or HPV tests in the past 10 years.  HPV screening.** / Every 3 years from ages 93 years through ages 70 or 45 years with a history of 3 consecutive normal Pap tests. Testing can be stopped between 65 and 70 years with 3 consecutive normal Pap tests and no abnormal Pap or HPV tests in the past 10 years.  Fecal occult blood test (FOBT) of stool. / Every year beginning at age 8 years and continuing until age 45 years. You may not need to do this test if you get a colonoscopy every 10 years.  Flexible sigmoidoscopy or colonoscopy.** /  Every 5 years for a flexible sigmoidoscopy or every 10 years for a colonoscopy beginning at age 69 years and continuing until age 68 years.  Hepatitis C blood test.** / For all people born from [birth year] through 1963 and any individual with known risks for hepatitis C.  Osteoporosis screening.** / A one-time screening for women ages 7 years and over and women at risk for fractures or osteoporosis.  Skin self-exam. / Monthly.  Influenza vaccine. / Every year.  Tetanus, diphtheria, and acellular pertussis (Tdap/Td) vaccine.** / 1 dose of Td every 10 years.  Varicella vaccine.** / Consult your health care provider.  Zoster vaccine.** / 1 dose for adults aged 5 years or older.  Pneumococcal 13-valent conjugate (PCV13) vaccine.** / Consult your health care provider.  Pneumococcal polysaccharide (PPSV23) vaccine.** / 1 dose for all adults aged 74 years and older.  Meningococcal vaccine.** / Consult your health care provider.  Hepatitis A vaccine.** / Consult your health care provider.  Hepatitis B vaccine.** / Consult your health care provider.  Haemophilus influenzae type b (Hib) vaccine.** / Consult your health care provider. ** Family history and personal history of risk and conditions may change your health care provider's recommendations. Document Released: 12/06/2001 Document Revised: 07/31/2013  [facility] Patient Information 2014 [place], [place].   EXERCISE AND DIET:  We recommended that you start or continue a regular exercise program for good health. Regular exercise means any activity that makes your heart beat faster and makes you sweat.  We recommend exercising at least 30 minutes per day at least 3 days a week, preferably 5.  We also recommend a diet low in fat and sugar / carbohydrates.  Inactivity, poor dietary choices and obesity can cause diabetes, heart attack, stroke, and kidney damage, among others.     ALCOHOL AND SMOKING:  Women should limit their alcohol intake to no  more than 7 drinks/beers/glasses of wine (combined, not each!) per week. Moderation of alcohol intake to this level decreases your risk of breast cancer and liver damage.  ( And of course, no recreational drugs are part of a healthy lifestyle.)  Also, you should not be smoking at all or even being exposed to second hand smoke. Most people know smoking can  cause cancer, and various heart and lung diseases, but did you know it also contributes to weakening of your bones?  Aging of your skin?  Yellowing of your teeth and nails?   CALCIUM AND VITAMIN D:  Adequate intake of calcium and Vitamin D are recommended.  The recommendations for exact amounts of these supplements seem to change often, but generally speaking 600 mg of calcium (either carbonate or citrate) and 800 units of Vitamin D per day seems prudent. Certain women may benefit from higher intake of Vitamin D.  If you are among these women, your doctor will have told you during your visit.     PAP SMEARS:  Pap smears, to check for cervical cancer or precancers,  have traditionally been done yearly, although recent scientific advances have shown that most women can have pap smears less often.  However, every woman still should have a physical exam from her gynecologist or primary care physician every year. It will include a breast check, inspection of the vulva and vagina to check for abnormal growths or skin changes, a visual exam of the cervix, and then an exam to evaluate the size and shape of the uterus and ovaries.  And after 30 years of age, a rectal exam is indicated to check for rectal cancers. We will also provide age appropriate advice regarding health maintenance, like when you should have certain vaccines, screening for sexually transmitted diseases, bone density testing, colonoscopy, mammograms, etc.    MAMMOGRAMS:  All women over 65 years old should have a yearly mammogram. Many facilities now offer a "3D" mammogram, which may cost  around $50 extra out of pocket. If possible,  we recommend you accept the option to have the 3D mammogram performed.  It both reduces the number of women who will be called back for extra views which then turn out to be normal, and it is better than the routine mammogram at detecting truly abnormal areas.     COLONOSCOPY:  Colonoscopy to screen for colon cancer is recommended for all women at age 75.  We know, you hate the idea of the prep.  We agree, BUT, having colon cancer and not knowing it is worse!!  Colon cancer so often starts as a polyp that can be seen and removed at colonscopy, which can quite literally save your life!  And if your first colonoscopy is normal and you have no family history of colon cancer, most women don't have to have it again for 10 years.  Once every ten years, you can do something that may end up saving your life, right?  We will be happy to help you get it scheduled when you are ready.  Be sure to check your insurance coverage so you understand how much it will cost.  It may be covered as a preventative service at no cost, but you should check your particular policy.    Overall you are doing a GREAT JOB taking care of yourself! Continue to drink plenty of water and follow heart healthy diet. Recent labs look good! Recommend annual physical with fasting labs. If you would ever like a referral to [facility], please call and we will place a referral. ENJOY HOSTING THANKSGIVING! NICE TO SEE YOU!

## 2018-09-13 DIAGNOSIS — Z85828 Personal history of other malignant neoplasm of skin: Secondary | ICD-10-CM | POA: Diagnosis not present

## 2018-09-13 DIAGNOSIS — D225 Melanocytic nevi of trunk: Secondary | ICD-10-CM | POA: Diagnosis not present

## 2018-09-13 DIAGNOSIS — D2262 Melanocytic nevi of left upper limb, including shoulder: Secondary | ICD-10-CM | POA: Diagnosis not present

## 2018-10-24 NOTE — L&D Delivery Note (Signed)
Operative Delivery Note Patient did one push and brought baby to +4. Contractions were now every 1 minute with FHT down on 70s. Terbutaline given at 5 minutes into decel. FHR still down at 10 minutes, so decision was made for vacuum assisted vaginal delivery for fetal bradycardia.    Verbal consent: obtained from patient.  Risks and benefits discussed in detail.  Risks include, but are not limited to the risks of anesthesia, bleeding, infection, damage to maternal tissues, fetal cephalhematoma.  There is also the risk of inability to effect vaginal delivery of the head, or shoulder dystocia that cannot be resolved by established maneuvers, leading to the need for emergency cesarean section.  At 11:59 AM a viable and healthy female was delivered via Vaginal, Vacuum Neurosurgeon).  Presentation: vertex; Position: Occiput,, Anterior; Station: +4. APGAR: 8, 9; weight  -pending.   Placenta status: spontaneous, complete .   Cord:  with the following complications: none.  Cord pH: NA (thin cord, could not get)- baby with vigorous cry. NICU on standby for over 1 minute and left the room due to no concerns.   Anesthesia:  Epidural Instruments: Kiwi, one application and one pull. Episiotomy: Median (2nd degree, no extension) Lacerations: none other,  Suture Repair: 3.0 vicryl rapide Est. Blood Loss (mL):  681 cc  Mom to postpartum.  Baby to Couplet care / Skin to Skin.  [name] 06/16/2019, 12:37 PM

## 2018-10-30 DIAGNOSIS — Z3201 Encounter for pregnancy test, result positive: Secondary | ICD-10-CM | POA: Diagnosis not present

## 2018-11-14 DIAGNOSIS — Z3201 Encounter for pregnancy test, result positive: Secondary | ICD-10-CM | POA: Diagnosis not present

## 2018-12-05 DIAGNOSIS — O09291 Supervision of pregnancy with other poor reproductive or obstetric history, first trimester: Secondary | ICD-10-CM | POA: Diagnosis not present

## 2018-12-05 DIAGNOSIS — Z118 Encounter for screening for other infectious and parasitic diseases: Secondary | ICD-10-CM | POA: Diagnosis not present

## 2018-12-05 DIAGNOSIS — Z3481 Encounter for supervision of other normal pregnancy, first trimester: Secondary | ICD-10-CM | POA: Diagnosis not present

## 2019-02-06 DIAGNOSIS — C44519 Basal cell carcinoma of skin of other part of trunk: Secondary | ICD-10-CM | POA: Diagnosis not present

## 2019-02-06 DIAGNOSIS — L811 Chloasma: Secondary | ICD-10-CM | POA: Diagnosis not present

## 2019-04-14 IMAGING — US US MFM OB LIMITED
1 series · 14 of 28 positions shown · non-contrast
Comparison: none

[Series 1: us mfm ob limited · 30 acquisitions, 14 frames shown]
[im 2/30]
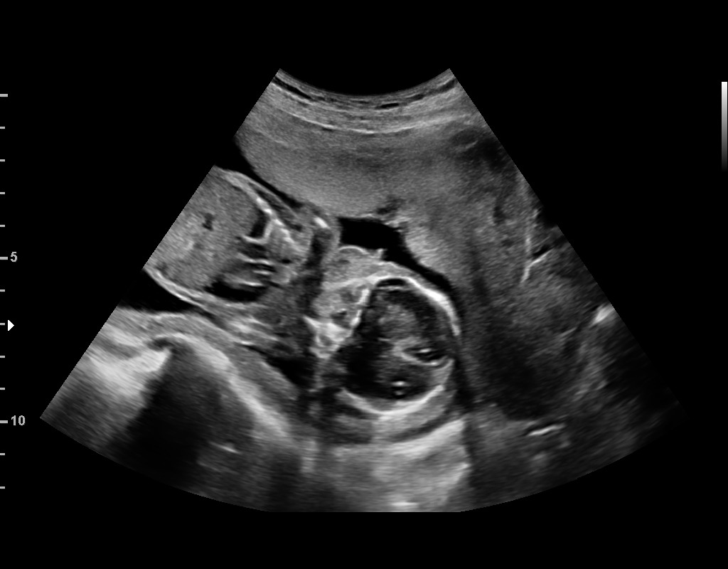
[im 4/30]
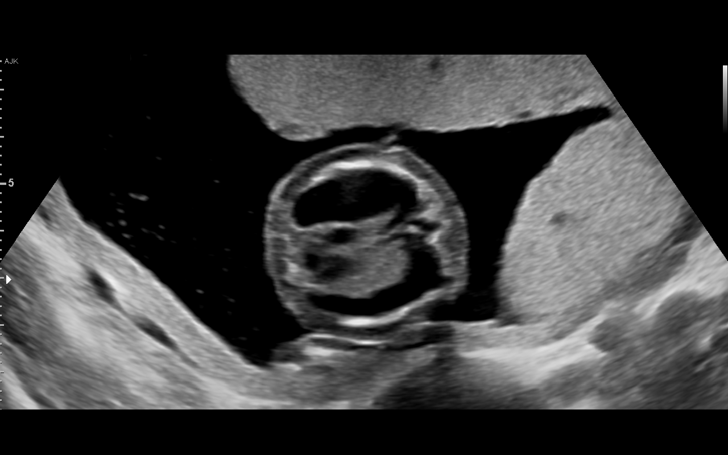
[im 6/30]
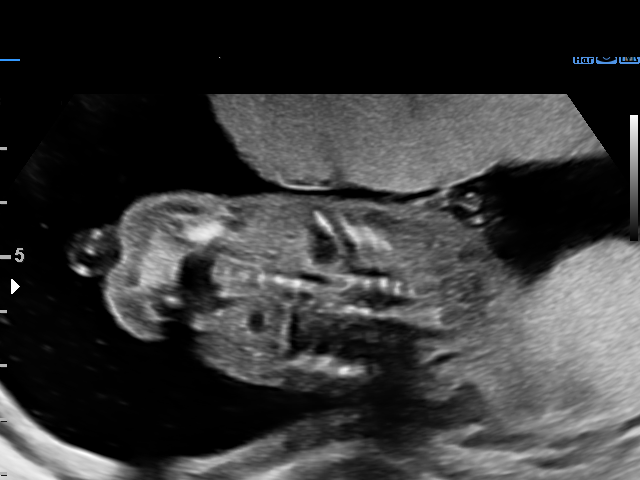
[im 8/30]
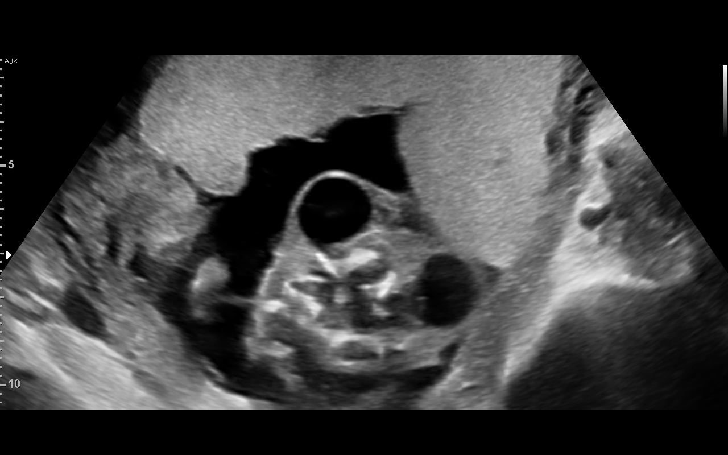
[im 10/30]
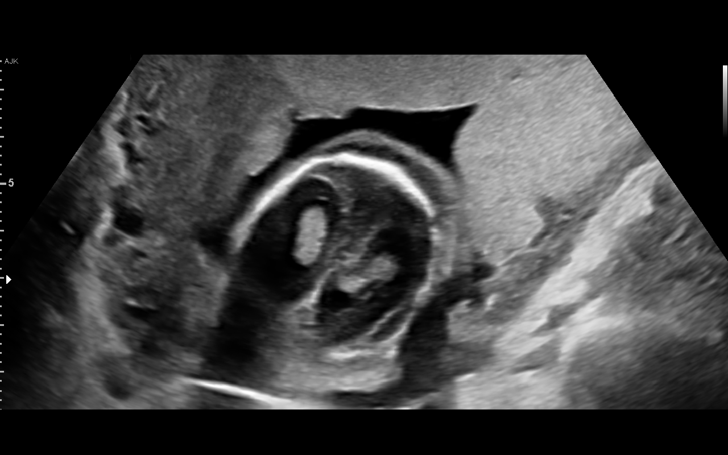
[im 12/30]
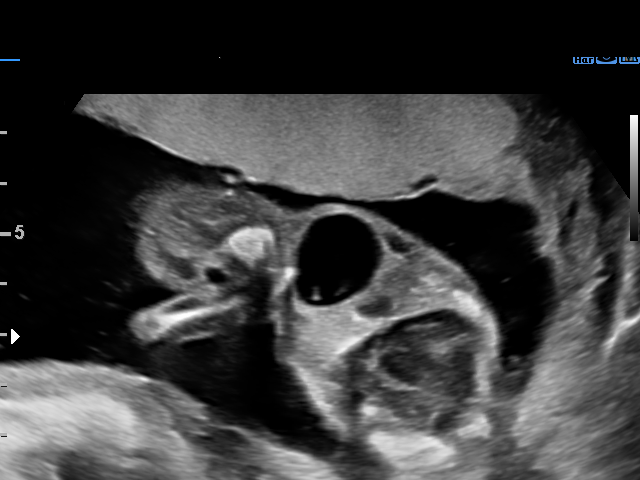
[im 14/30]
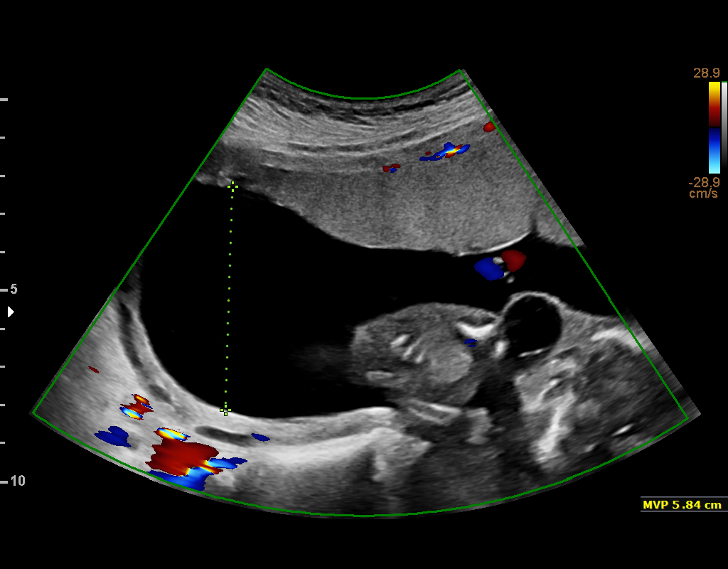
[im 17/30]
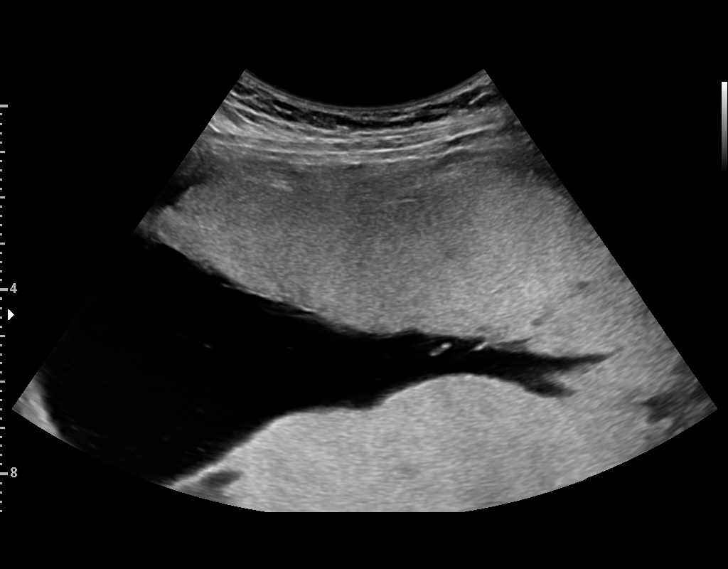
[im 19/30]
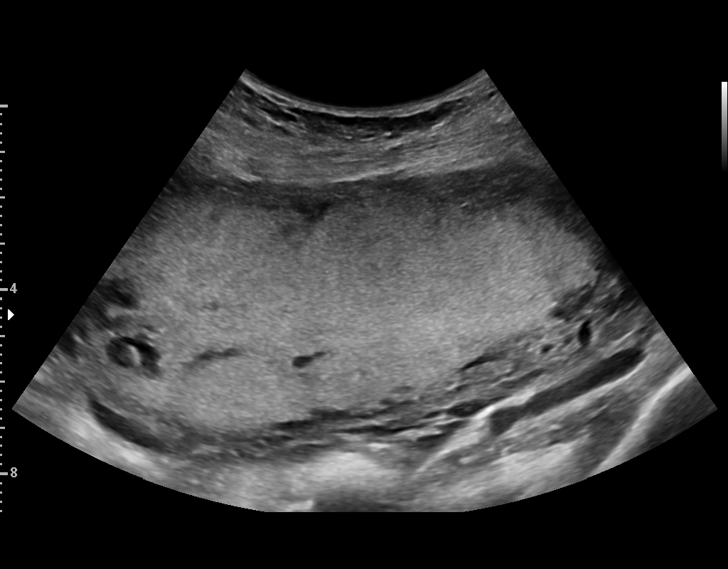
[im 21/30]
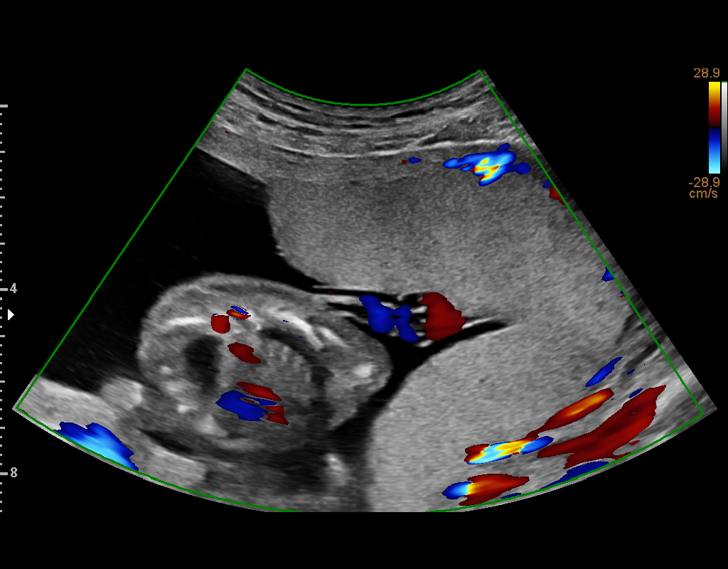
[im 23/30]
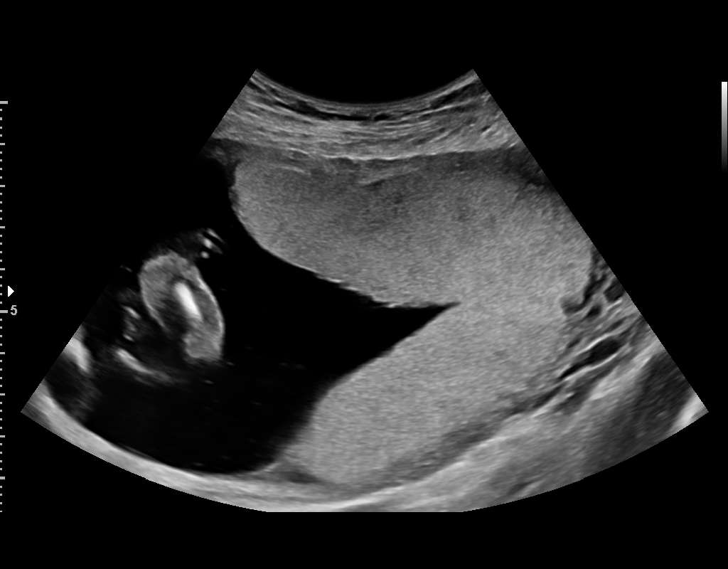
[im 25/30]
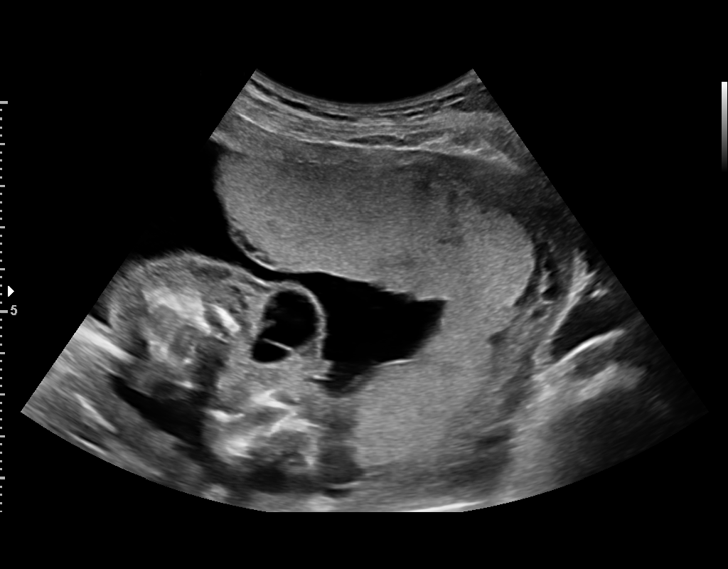
[im 27/30]
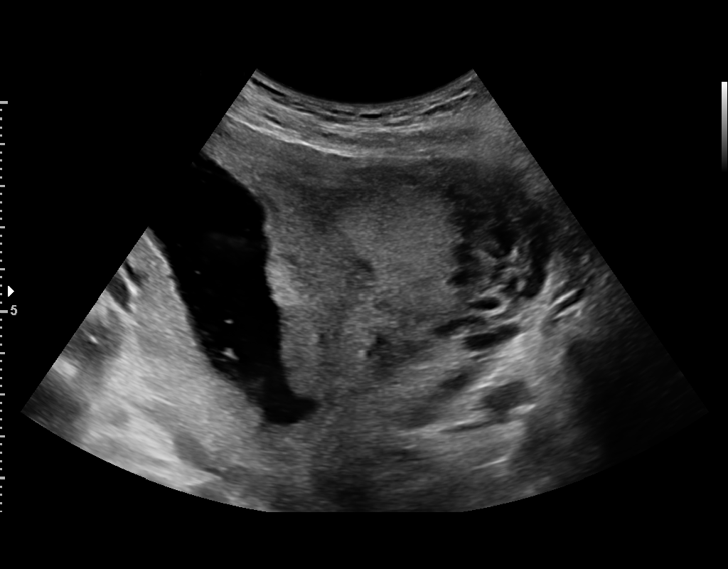
[im 30/30]
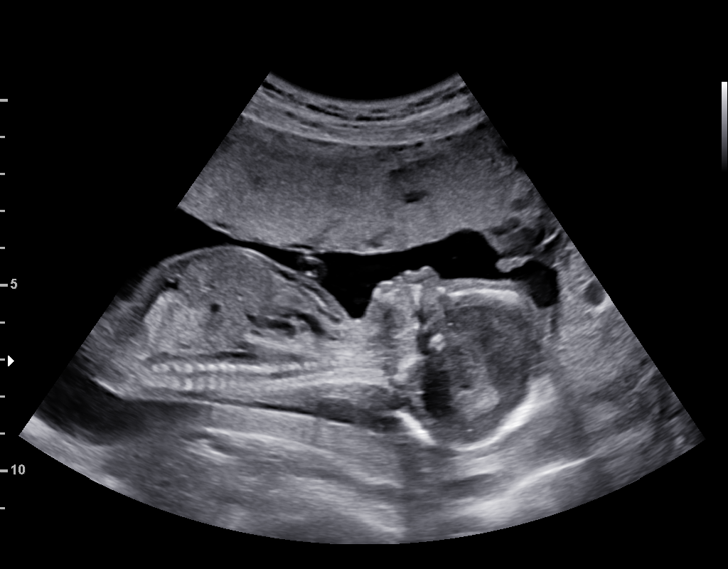

[14 of 28 positions shown; findings below may reference images not displayed]

1  AKI USSERY              [id number]      [id number]     [id number]
Indications

17 weeks gestation of pregnancy
Fetal abnormality - other known or
suspected (cystic and nuchal hygroma;
pleural effusion - hydrops
Asthma                                         ANN.1N j84.010
OB History

Blood Type:            Height:  5'1"   Weight (lb):  123       BMI:
Gravidity:    2         Term:   1
Living:       1
Fetal Evaluation

Num Of Fetuses:     1
Fetal Heart         154
Rate(bpm):
Cardiac Activity:   Observed
Presentation:       Variable
Placenta:           Anterior
P. Cord Insertion:  Visualized, central

Amniotic Fluid
AFI FV:      Subjectively within normal limits

Largest Pocket(cm)
5.84
Gestational Age

Best:          17w 5d     Det. By:  Previous Ultrasound      EDD:   06/16/18
(12/05/17)
Anatomy

Nuchal Fold:           Cystic hygroma         Thoracic:               Pleural effusion
Impression

SIUP at 17+2 weeks
Cystic/nuchal hygroma; worsening, bilateral pleural effusions
Normal amniotic fluid volume
Recommendations

Ms. Plunkett would like to come back in 3 weeks for detailed
survey
ECHO appt next week

## 2019-06-05 LAB — HEPATIC FUNCTION PANEL
ALT: 13 (ref 7–35)
AST: 20 (ref 13–35)
Alkaline Phosphatase: 146 — AB (ref 25–125)
Bilirubin, Total: 0.3

## 2019-06-05 LAB — BASIC METABOLIC PANEL
BUN: 5 (ref 4–21)
CO2: 20 (ref 13–22)
Chloride: 104 (ref 99–108)
Creatinine: 0.5 (ref 0.5–1.1)
Glucose: 80
Potassium: 4 (ref 3.4–5.3)
Sodium: 137 (ref 137–147)

## 2019-06-05 LAB — COMPREHENSIVE METABOLIC PANEL
Albumin: 3.4 — AB (ref 3.5–5.0)
Calcium: 8.8 (ref 8.7–10.7)

## 2019-06-16 ENCOUNTER — Encounter (HOSPITAL_COMMUNITY): Payer: Self-pay

## 2019-06-16 ENCOUNTER — Other Ambulatory Visit: Payer: Self-pay

## 2019-06-16 ENCOUNTER — Inpatient Hospital Stay (HOSPITAL_COMMUNITY): Payer: 59 | Admitting: Anesthesiology

## 2019-06-16 ENCOUNTER — Inpatient Hospital Stay (HOSPITAL_COMMUNITY)
Admission: AD | Admit: 2019-06-16 | Discharge: 2019-06-17 | DRG: 806 | Disposition: A | Discharge status: Home / Self Care | Payer: 59 | Attending: Obstetrics & Gynecology | Admitting: Obstetrics & Gynecology

## 2019-06-16 DIAGNOSIS — J45909 Unspecified asthma, uncomplicated: Secondary | ICD-10-CM | POA: Diagnosis present

## 2019-06-16 DIAGNOSIS — Z20828 Contact with and (suspected) exposure to other viral communicable diseases: Secondary | ICD-10-CM | POA: Diagnosis present

## 2019-06-16 DIAGNOSIS — O9081 Anemia of the puerperium: Secondary | ICD-10-CM | POA: Diagnosis present

## 2019-06-16 DIAGNOSIS — D62 Acute posthemorrhagic anemia: Secondary | ICD-10-CM | POA: Diagnosis not present

## 2019-06-16 DIAGNOSIS — O2243 Hemorrhoids in pregnancy, third trimester: Secondary | ICD-10-CM | POA: Diagnosis present

## 2019-06-16 DIAGNOSIS — Z3490 Encounter for supervision of normal pregnancy, unspecified, unspecified trimester: Secondary | ICD-10-CM

## 2019-06-16 DIAGNOSIS — Z3A39 39 weeks gestation of pregnancy: Secondary | ICD-10-CM | POA: Diagnosis not present

## 2019-06-16 DIAGNOSIS — Z85828 Personal history of other malignant neoplasm of skin: Secondary | ICD-10-CM | POA: Diagnosis not present

## 2019-06-16 DIAGNOSIS — O9952 Diseases of the respiratory system complicating childbirth: Secondary | ICD-10-CM | POA: Diagnosis present

## 2019-06-16 DIAGNOSIS — O26893 Other specified pregnancy related conditions, third trimester: Secondary | ICD-10-CM | POA: Diagnosis present

## 2019-06-16 LAB — CBC
HCT: 40.9 % (ref 36.0–46.0)
Hemoglobin: 13.5 g/dL (ref 12.0–15.0)
MCH: 31.5 pg (ref 26.0–34.0)
MCHC: 33 g/dL (ref 30.0–36.0)
MCV: 95.6 fL (ref 80.0–100.0)
Platelets: 256 10*3/uL (ref 150–400)
RBC: 4.28 MIL/uL (ref 3.87–5.11)
RDW: 13.2 % (ref 11.5–15.5)
WBC: 16.2 10*3/uL — ABNORMAL HIGH (ref 4.0–10.5)
nRBC: 0 % (ref 0.0–0.2)

## 2019-06-16 LAB — ABO/RH: ABO/RH(D): O POS

## 2019-06-16 LAB — TYPE AND SCREEN
ABO/RH(D): O POS
Antibody Screen: NEGATIVE

## 2019-06-16 LAB — SARS CORONAVIRUS 2 (TAT 6-24 HRS): SARS Coronavirus 2: NEGATIVE

## 2019-06-16 LAB — RPR: RPR Ser Ql: NONREACTIVE

## 2019-06-16 MED ORDER — OXYCODONE-ACETAMINOPHEN 5-325 MG PO TABS: 1.0000 | ORAL_TABLET | ORAL | Status: DC | PRN

## 2019-06-16 MED ORDER — ACETAMINOPHEN 325 MG PO TABS: 650.0000 mg | ORAL_TABLET | ORAL | Status: DC | PRN

## 2019-06-16 MED ORDER — PRENATAL MULTIVITAMIN CH
1.0000 | ORAL_TABLET | Freq: Every day | ORAL | Status: DC
  Administered 2019-06-17: 12:00:00 1 via ORAL
  Filled 2019-06-16: qty 1

## 2019-06-16 MED ORDER — IBUPROFEN 600 MG PO TABS
600.0000 mg | ORAL_TABLET | Freq: Four times a day (QID) | ORAL | Status: DC
  Administered 2019-06-16 – 2019-06-17 (×4): 600 mg via ORAL
  Filled 2019-06-16 (×4): qty 1

## 2019-06-16 MED ORDER — ALBUTEROL SULFATE HFA 108 (90 BASE) MCG/ACT IN AERS
2.0000 | INHALATION_SPRAY | Freq: Four times a day (QID) | RESPIRATORY_TRACT | Status: DC | PRN
  Filled 2019-06-16: qty 6.7

## 2019-06-16 MED ORDER — PHENYLEPHRINE 40 MCG/ML (10ML) SYRINGE FOR IV PUSH (FOR BLOOD PRESSURE SUPPORT)
80.0000 ug | PREFILLED_SYRINGE | INTRAVENOUS | Status: DC | PRN
  Filled 2019-06-16: qty 10

## 2019-06-16 MED ORDER — OXYCODONE HCL 5 MG PO TABS: 5.0000 mg | ORAL_TABLET | Freq: Four times a day (QID) | ORAL | Status: DC | PRN

## 2019-06-16 MED ORDER — ONDANSETRON HCL 4 MG PO TABS: 4.0000 mg | ORAL_TABLET | ORAL | Status: DC | PRN

## 2019-06-16 MED ORDER — DIPHENHYDRAMINE HCL 25 MG PO CAPS: 25.0000 mg | ORAL_CAPSULE | Freq: Four times a day (QID) | ORAL | Status: DC | PRN

## 2019-06-16 MED ORDER — DIPHENHYDRAMINE HCL 50 MG/ML IJ SOLN: 12.5000 mg | INTRAMUSCULAR | Status: DC | PRN

## 2019-06-16 MED ORDER — ONDANSETRON HCL 4 MG/2ML IJ SOLN: 4.0000 mg | Freq: Four times a day (QID) | INTRAMUSCULAR | Status: DC | PRN

## 2019-06-16 MED ORDER — SIMETHICONE 80 MG PO CHEW: 80.0000 mg | CHEWABLE_TABLET | ORAL | Status: DC | PRN

## 2019-06-16 MED ORDER — PHENYLEPHRINE 40 MCG/ML (10ML) SYRINGE FOR IV PUSH (FOR BLOOD PRESSURE SUPPORT): 80.0000 ug | PREFILLED_SYRINGE | INTRAVENOUS | Status: DC | PRN

## 2019-06-16 MED ORDER — OXYTOCIN 40 UNITS IN NORMAL SALINE INFUSION - SIMPLE MED
2.5000 [IU]/h | INTRAVENOUS | Status: DC
  Filled 2019-06-16: qty 1000

## 2019-06-16 MED ORDER — LIDOCAINE HCL (PF) 1 % IJ SOLN
INTRAMUSCULAR | Status: DC | PRN
  Administered 2019-06-16: 10 mL via EPIDURAL

## 2019-06-16 MED ORDER — BENZOCAINE-MENTHOL 20-0.5 % EX AERO
1.0000 "application " | INHALATION_SPRAY | CUTANEOUS | Status: DC | PRN
  Filled 2019-06-16: qty 56

## 2019-06-16 MED ORDER — LIDOCAINE HCL (PF) 1 % IJ SOLN
30.0000 mL | INTRAMUSCULAR | Status: AC | PRN
  Administered 2019-06-16: 12:00:00 30 mL via SUBCUTANEOUS
  Filled 2019-06-16: qty 30

## 2019-06-16 MED ORDER — OXYCODONE-ACETAMINOPHEN 5-325 MG PO TABS: 2.0000 | ORAL_TABLET | ORAL | Status: DC | PRN

## 2019-06-16 MED ORDER — LACTATED RINGERS IV SOLN
INTRAVENOUS | Status: DC
  Administered 2019-06-16: 09:00:00 via INTRAVENOUS

## 2019-06-16 MED ORDER — WITCH HAZEL-GLYCERIN EX PADS: 1.0000 "application " | MEDICATED_PAD | CUTANEOUS | Status: DC | PRN

## 2019-06-16 MED ORDER — EPHEDRINE 5 MG/ML INJ: 10.0000 mg | INTRAVENOUS | Status: DC | PRN

## 2019-06-16 MED ORDER — DIBUCAINE (PERIANAL) 1 % EX OINT: 1.0000 "application " | TOPICAL_OINTMENT | CUTANEOUS | Status: DC | PRN

## 2019-06-16 MED ORDER — TERBUTALINE SULFATE 1 MG/ML IJ SOLN
INTRAMUSCULAR | Status: AC
  Administered 2019-06-16: 12:00:00 0.25 mg
  Filled 2019-06-16: qty 1

## 2019-06-16 MED ORDER — SOD CITRATE-CITRIC ACID 500-334 MG/5ML PO SOLN: 30.0000 mL | ORAL | Status: DC | PRN

## 2019-06-16 MED ORDER — SODIUM CHLORIDE (PF) 0.9 % IJ SOLN
INTRAMUSCULAR | Status: DC | PRN
  Administered 2019-06-16: 12 mL/h via EPIDURAL

## 2019-06-16 MED ORDER — COCONUT OIL OIL
1.0000 "application " | TOPICAL_OIL | Status: DC | PRN
  Administered 2019-06-17: 1 via TOPICAL

## 2019-06-16 MED ORDER — LACTATED RINGERS IV SOLN
500.0000 mL | Freq: Once | INTRAVENOUS | Status: AC
  Administered 2019-06-16: 08:00:00 500 mL via INTRAVENOUS

## 2019-06-16 MED ORDER — ONDANSETRON HCL 4 MG/2ML IJ SOLN: 4.0000 mg | INTRAMUSCULAR | Status: DC | PRN

## 2019-06-16 MED ORDER — ZOLPIDEM TARTRATE 5 MG PO TABS: 5.0000 mg | ORAL_TABLET | Freq: Every evening | ORAL | Status: DC | PRN

## 2019-06-16 MED ORDER — FENTANYL-BUPIVACAINE-NACL 0.5-0.125-0.9 MG/250ML-% EP SOLN
12.0000 mL/h | EPIDURAL | Status: DC | PRN
  Filled 2019-06-16: qty 250

## 2019-06-16 MED ORDER — LACTATED RINGERS IV SOLN: 500.0000 mL | INTRAVENOUS | Status: DC | PRN

## 2019-06-16 MED ORDER — TETANUS-DIPHTH-ACELL PERTUSSIS 5-2.5-18.5 LF-MCG/0.5 IM SUSP: 0.5000 mL | Freq: Once | INTRAMUSCULAR | Status: DC

## 2019-06-16 MED ORDER — OXYTOCIN BOLUS FROM INFUSION
500.0000 mL | Freq: Once | INTRAVENOUS | Status: AC
  Administered 2019-06-16: 12:00:00 500 mL via INTRAVENOUS

## 2019-06-16 MED ORDER — SENNOSIDES-DOCUSATE SODIUM 8.6-50 MG PO TABS
2.0000 | ORAL_TABLET | ORAL | Status: DC
  Administered 2019-06-16: 2 via ORAL
  Filled 2019-06-16: qty 2

## 2019-06-16 NOTE — H&P (Signed)
Meagan Torres is a 33 y.o. female presenting for active labor. UCs since yesterday got worse at 2 am. No LOF. +FMs. Some pink discharge.  [facility]- CNMs at [facility]. Aware CNM not available today. Desires epidural.  Uncomplicated preg, Panorama nl, NT scan nl, Anatomy nl. Growth scan in 3 rd trim nl, NST from 36 wks- reactive, GBS(-), Rh pos, TDAP done   G3P1101- Term VAVD with 3rd degree lac, Boy, 8'3", then IUFD 22 wks with cystic hygromas, hydrops (girl 2019). Stable Asthma    OB History    Gravida  3   Para  2   Term  1   Preterm  1   AB      Living  1     SAB      TAB      Ectopic      Multiple  0   Live Births  1          Past Medical History:  Diagnosis Date  . Asthma    Past Surgical History:  Procedure Laterality Date  . BASAL CELL CARCINOMA EXCISION    . NO PAST SURGERIES    . WISDOM TOOTH EXTRACTION     Family History: family history includes Cancer in her father, maternal grandfather, and maternal grandmother; Hypertension in her maternal grandmother and mother; Stroke in her paternal grandfather. Social History:  reports that she has never smoked. She has never used smokeless tobacco. She reports current alcohol use. She reports that she does not use drugs.     Maternal Diabetes: No Genetic Screening: Normal- Nl Panorama and nl NT scan and AFP1  Maternal Ultrasounds/Referrals: Normal Fetal Ultrasounds or other Referrals:  None Maternal Substance Abuse:  No Significant Maternal Medications:  None Significant Maternal Lab Results:  Group B Strep negative Other Comments:  None  ROS History Dilation: 5 Effacement (%): 70 Station: -2, -3 Exam by:: Denyse Dago, RN Blood pressure (!) 137/93, pulse (!) 102, temperature 99.1 F (37.3 C), temperature source Oral, resp. rate 18, height 5\' 1"  (1.549 m), weight 71.2 kg, last menstrual period 08/15/2018, SpO2 99 %. Exam Physical Exam  Physical exam:  A&O x 3, no acute distress. Pleasant HEENT neg,  no thyromegaly Lungs CTA bilat CV RRR, S1S2 normal Abdo soft, non tender, non acute Extr no edema/ tenderness Pelvic 5 cm / BBOW/ Vx/ -1 per RN  FHT  150s + accels no decels mod variab - cat I Toco regular q 3-4 min  Prenatal labs: ABO, Rh:  O(+) Antibody:  Neg Rubella:  Imm RPR:   NR HBsAg:   Neg HIV:   Neg GBS:   Neg Glucola nl AFP1 neg Panorama Nl  Assessment/Plan: 33 yo G3P1101 at 39.3 wks in active labor. FHT cat I GBS(-). Anticipate SVD Epidural desired Covid test per protocol   [name] 06/16/2019, 6:50 AM

## 2019-06-16 NOTE — MAU Note (Signed)
Pt reports contractions since yesterday, worsening after dinner last night, now every 3-5 mins. Denies LOF or vaginal bleeding. Reports good fetal movement. Cervix has not been examined.

## 2019-06-16 NOTE — Anesthesia Preprocedure Evaluation (Signed)
Anesthesia Evaluation  Patient identified by MRN, date of birth, ID band Patient awake    Reviewed: Allergy & Precautions, H&P , NPO status , Patient's Chart, lab work & pertinent test results  History of Anesthesia Complications Negative for: history of anesthetic complications  Airway Mallampati: II  TM Distance: >3 FB Neck ROM: full    Dental no notable dental hx.    Pulmonary asthma ,    Pulmonary exam normal        Cardiovascular negative cardio ROS Normal cardiovascular exam Rhythm:regular Rate:Normal     Neuro/Psych negative neurological ROS  negative psych ROS   GI/Hepatic negative GI ROS, Neg liver ROS,   Endo/Other  negative endocrine ROS  Renal/GU negative Renal ROS  negative genitourinary   Musculoskeletal   Abdominal   Peds  Hematology negative hematology ROS (+)   Anesthesia Other Findings   Reproductive/Obstetrics (+) Pregnancy                             Anesthesia Physical Anesthesia Plan  ASA: II  Anesthesia Plan: Epidural   Post-op Pain Management:    Induction:   PONV Risk Score and Plan:   Airway Management Planned:   Additional Equipment:   Intra-op Plan:   Post-operative Plan:   Informed Consent: I have reviewed the patients History and Physical, chart, labs and discussed the procedure including the risks, benefits and alternatives for the proposed anesthesia with the patient or authorized representative who has indicated his/her understanding and acceptance.       Plan Discussed with:   Anesthesia Plan Comments:         Anesthesia Quick Evaluation

## 2019-06-16 NOTE — Anesthesia Postprocedure Evaluation (Signed)
Anesthesia Post Note  Patient: Meagan Torres  Procedure(s) Performed: AN AD [place]     Patient location during evaluation: Mother Baby Anesthesia Type: Epidural Level of consciousness: awake Pain management: satisfactory to patient Vital Signs Assessment: post-procedure vital signs reviewed and stable Respiratory status: spontaneous breathing Cardiovascular status: stable Anesthetic complications: no    Last Vitals:  Vitals:   06/16/19 1449 06/16/19 1549  BP: 120/77 116/86  Pulse: 99 (!) 103  Resp: 18 16  Temp: 37.4 C 37.3 C  SpO2: 97% 97%    Last Pain:  Vitals:   06/16/19 1751  TempSrc:   PainSc: 4    Pain Goal: Patients Stated Pain Goal: 2 (06/16/19 1751)                 Casimer Lanius

## 2019-06-16 NOTE — Progress Notes (Signed)
Meagan Torres is a 33 y.o. G3P1101 at [redacted]w[redacted]d by 1st trim sono, active labor admit  Subjective: Epidural feels well   Objective: BP 117/69   Pulse (!) 101   Temp 99.1 F (37.3 C) (Oral)   Resp 16   Ht 5\' 1"  (1.549 m)   Wt 71.2 kg   LMP 08/15/2018 (Exact Date)   SpO2 98%   BMI 29.65 kg/m    FHT:  FHR: 150 bpm, variability: moderate,  accelerations:  Present,  decelerations:  Absent UC:   regular, every 3-5 minutes SVE:   Dilation: 7.5 Effacement (%): 100 Station: 0, -1 Exam by:: Sheilia Reznick AROM, particulate meconium, head well applied   Labs: Lab Results  Component Value Date   WBC 16.2 (H) 06/16/2019   HGB 13.5 06/16/2019   HCT 40.9 06/16/2019   MCV 95.6 06/16/2019   PLT 256 06/16/2019    Assessment / Plan: Spontaneous labor, progressing normally  Labor: Progressing normally Fetal Wellbeing:  Category I Pain Control:  Epidural I/D:  n/a Anticipated MOD:  NSVD  Meagan Torres 06/16/2019, 10:35 AM

## 2019-06-16 NOTE — Anesthesia Procedure Notes (Signed)
Epidural Patient location during procedure: OB Start time: 06/16/2019 8:04 AM End time: 06/16/2019 8:15 AM  Staffing Anesthesiologist: Lidia Collum, MD Performed: anesthesiologist   Preanesthetic Checklist Completed: patient identified, pre-op evaluation, timeout performed, IV checked, risks and benefits discussed and monitors and equipment checked  Epidural Patient position: sitting Prep: DuraPrep Patient monitoring: heart rate, continuous pulse ox and blood pressure Approach: midline Location: L3-L4 Injection technique: LOR air  Needle:  Needle type: Tuohy  Needle gauge: 17 G Needle length: 9 cm Needle insertion depth: 4 cm Catheter type: closed end flexible Catheter size: 19 Gauge Catheter at skin depth: 9 cm Test dose: negative  Assessment Events: blood not aspirated, injection not painful, no injection resistance, negative IV test and no paresthesia  Additional Notes Reason for block:procedure for pain

## 2019-06-17 LAB — CBC
HCT: 34.4 % — ABNORMAL LOW (ref 36.0–46.0)
Hemoglobin: 11.5 g/dL — ABNORMAL LOW (ref 12.0–15.0)
MCH: 31.8 pg (ref 26.0–34.0)
MCHC: 33.4 g/dL (ref 30.0–36.0)
MCV: 95 fL (ref 80.0–100.0)
Platelets: 224 10*3/uL (ref 150–400)
RBC: 3.62 MIL/uL — ABNORMAL LOW (ref 3.87–5.11)
RDW: 12.9 % (ref 11.5–15.5)
WBC: 17.4 10*3/uL — ABNORMAL HIGH (ref 4.0–10.5)
nRBC: 0 % (ref 0.0–0.2)

## 2019-06-17 MED ORDER — HYDROCORT-PRAMOXINE (PERIANAL) 1-1 % EX FOAM
1.0000 | Freq: Two times a day (BID) | CUTANEOUS | Status: DC
  Administered 2019-06-17: 1 via RECTAL
  Filled 2019-06-17: qty 10

## 2019-06-17 MED ORDER — HYDROCORT-PRAMOXINE (PERIANAL) 1-1 % EX FOAM: 1.0000 | Freq: Two times a day (BID) | CUTANEOUS | 0 refills | Status: DC

## 2019-06-17 MED ORDER — IBUPROFEN 600 MG PO TABS: 600.0000 mg | ORAL_TABLET | Freq: Four times a day (QID) | ORAL | 0 refills | Status: DC

## 2019-06-17 NOTE — Discharge Summary (Signed)
Obstetric Discharge Summary   Patient Name: Meagan Torres DOB: 06/02/1986 MRN: [id number]  Date of Admission: 06/16/2019 Date of Discharge: 06/17/2019 Date of Delivery: 06/16/2019 Gestational Age at Delivery: [redacted]w[redacted]d  Primary [facility]: [facility] - CNM management/T. Mel Almond, CNM   Antepartum complications:  Uncomplicated preg, Panorama nl, NT scan nl, Anatomy nl. Growth scan in 3 rd trim nl, NST from 36 wks- reactive, GBS(-), Rh pos, TDAP done   G3P1101- Term VAVD with 3rd degree lac, Boy, 8'3", then IUFD 22 wks with cystic hygromas, hydrops (girl 2019). Stable Asthma   - Anxiety d/t pregnancy loss, not on medication  Prenatal Labs:  ABO, Rh:  O(+) Antibody:  Neg Rubella:  Imm RPR:   NR HBsAg:   Neg HIV:   Neg GBS:   Neg Glucola nl AFP1 neg Panorama Nl Admitting Diagnosis: 39+3 weeks active labor  Secondary Diagnoses: Patient Active Problem List   Diagnosis Date Noted  . Postpartum care following VAVD (8/23) 06/17/2019  . Term pregnancy 06/16/2019  . Vacuum-assisted vaginal delivery 06/16/2019  . Healthcare maintenance 08/08/2018  . Labor abnormality 02/15/2018  . Fetal demise, greater than 22 weeks, antepartum, fetus 1 02/15/2018  . Cystic hygroma of fetus in singleton pregnancy 12/28/2017  . Fetal hydrops 12/28/2017  . [redacted] weeks gestation of pregnancy   . Vacuum extraction, delivered, current hospitalization 12/21/2014  . Third degree perineal laceration, delivered, current hospitalization 12/21/2014    Augmentation: AROM  Date of Delivery: 06/16/2019 Delivered By: Dr. Benjie Karvonen  Delivery Type: VAVD Anesthesia: epidural Placenta: spontaneous Laceration: 2nd degree (no extension) Episiotomy: Median   Newborn Data: Live born female  Birth Weight: 7 lb 5.5 oz (3330 g) APGAR: 8, 9  Newborn Delivery   Birth date/time: 06/16/2019 11:59:00 Delivery type: Vaginal, Vacuum (Extractor)      Hospital/Postpartum Course  (Vaginal Delivery): Pt. Admitted in active labor and  progressed with AROM. During 2nd stage, pt pushed baby to +1 station with contractions ever minute and FHR in 70s.Terbutaline given at 5 minutes into decel. FHR still down at 10 minutes, so decision was made for vacuum assisted vaginal delivery  With median episiotomy for fetal bradycardia.   Patient had an uncomplicated postpartum course.  By time of discharge on PPD#1, her pain was controlled on oral pain medications; she had appropriate lochia and was ambulating, voiding without difficulty and tolerating regular diet.  She was deemed stable for discharge to home.     Labs: CBC Latest Ref Rng & Units 06/17/2019 06/16/2019 09/05/2018  WBC 4.0 - 10.5 K/uL 17.4(H) 16.2(H) 6.7  Hemoglobin 12.0 - 15.0 g/dL 11.5(L) 13.5 13.9  Hematocrit 36.0 - 46.0 % 34.4(L) 40.9 40.9  Platelets 150 - 400 K/uL [id number]   Conflict (See Lab Report): O POS/O POS Performed at [facility], [place] [address] [place], [place] [address]   Physical exam:  BP 106/69 (BP Location: Left Arm)   Pulse 86   Temp 98.9 F (37.2 C) (Oral)   Resp 18   Ht 5\' 1"  (1.549 m)   Wt 71.2 kg   LMP 08/15/2018 (Exact Date)   SpO2 98%   Breastfeeding Unknown   BMI 29.65 kg/m   Alert and oriented X3             Lungs: Clear and unlabored             Heart: regular rate and rhythm / no murmurs             Abdomen: soft,  non-tender, non-distended              Fundus: firm, non-tender, U-1             Perineum: well approximated median episiotomy, (+) ecchymosis, (+) edema, (+) external hemorrhoid noted. Ice pack pad in place             Lochia: small rubra on pad             Extremities: trace LE edema, no calf pain or tenderness  Disposition: stable, discharge to home Baby Feeding: breast milk Baby Disposition: home with mom  Rh Immune globulin given: N/A Rubella vaccine given: N/A Tdap vaccine given in AP or PP setting: UTD Flu vaccine given in AP or PP setting: not on file    Plan:  Irean Gadow was discharged to  home in good condition. Follow-up appointment at [facility] in 6 weeks.  Discharge Instructions: Per After Visit Summary. Refer to After Visit Summary and [facility] discharge booklet  Activity: Advance as tolerated. Pelvic rest for 6 weeks.   Diet: Regular, Heart Healthy Discharge Medications: Allergies as of 06/17/2019   No Known Allergies     Medication List    TAKE these medications   hydrocortisone-pramoxine rectal foam Commonly known as: PROCTOFOAM-HC Place 1 applicator rectally 2 (two) times daily.   ibuprofen 600 MG tablet Commonly known as: ADVIL Take 1 tablet (600 mg total) by mouth every 6 (six) hours.   prenatal multivitamin Tabs tablet Take 1 tablet by mouth daily at 12 noon.   ProAir HFA 108 (90 Base) MCG/ACT inhaler Generic drug: albuterol Inhale 2 puffs into the lungs every 6 (six) hours as needed.            Discharge Care Instructions  (From admission, onward)         Start     Ordered   06/17/19 0000  Discharge wound care:    Comments: Alternate warm water sitz baths and ice packs   06/17/19 1103         Outpatient follow up:  Follow-up Information    Darliss Cheney, CNM. Schedule an appointment as soon as possible for a visit in 6 week(s).   Specialty: Obstetrics and Gynecology Why: Postpartum visit Contact information: [place] [address] [phone]           Signed:  Lars Pinks, MSN, CNM [facility]

## 2019-06-17 NOTE — Lactation Note (Signed)
This note was copied from a baby's chart. Lactation Consultation Note  Patient Name: Girl Ethelene Steketee [id number] Date: 06/17/2019 Reason for consult: Initial assessment Baby is 23 hours old/4% weight loss.  Mom breastfed her previous baby for 16 months.  She reports that newborn is feeding well.  Reviewed basics and answered questions.  Instructed to feed with cues and call for assist prn.  Breastfeeding consultation services and support information given and reviewed.  Maternal Data Does the patient have breastfeeding experience prior to this delivery?: Yes  Feeding Feeding Type: Breast Fed  LATCH Score                   Interventions    Lactation Tools Discussed/Used     Consult Status Consult Status: Follow-up Date: 06/18/19 Follow-up type: In-patient    Ave Filter 06/17/2019, 11:10 AM

## 2019-06-17 NOTE — Lactation Note (Signed)
This note was copied from a baby's chart. Lactation Consultation Note Attempted to see mom 2 different times during the night. Mom sleeping, FOB awake holding baby.  Patient Name: Meagan Torres [id number] Date: 06/17/2019     Maternal Data    Feeding Feeding Type: Breast Fed  LATCH Score                   Interventions    Lactation Tools Discussed/Used     Consult Status      Theodoro Kalata 06/17/2019, 6:43 AM

## 2019-06-17 NOTE — Progress Notes (Signed)
PPD #1, VAVD, median episiotomy (2nd degree no extension), baby girl "Meagan Torres"  S:  Reports feeling sore and tired; overall very happy with experience. Desires early d/c home at 24 hrs              Tolerating po/ No nausea or vomiting / Denies dizziness or SOB             Bleeding is light             Pain controlled with Motrin and Dermoplast             Up ad lib / ambulatory / voiding QS  Newborn breast feeding - going well; reports baby was spitting up some fluid, but is better.   O:               VS: BP 106/69 (BP Location: Left Arm)   Pulse 86   Temp 98.9 F (37.2 C) (Oral)   Resp 18   Ht 5\' 1"  (1.549 m)   Wt 71.2 kg   LMP 08/15/2018 (Exact Date)   SpO2 98%   Breastfeeding Unknown   BMI 29.65 kg/m    LABS:              Recent Labs    06/16/19 0655 06/17/19 0525  WBC 16.2* 17.4*  HGB 13.5 11.5*  PLT 256 224               Blood type: --/--/O POS, O POS Performed at [facility] [address] [place], [place] [address]  [id number] [phone])  Rubella:                       I&O: Intake/Output      08/23 0701 - 08/24 0700 08/24 0701 - 08/25 0700   Urine (mL/kg/hr) 800 (0.5)    Blood 681    Total Output 1481    Net -1481         Urine Occurrence 3 x                  Physical Exam:             Alert and oriented X3  Lungs: Clear and unlabored  Heart: regular rate and rhythm / no murmurs  Abdomen: soft, non-tender, non-distended              Fundus: firm, non-tender, U-1  Perineum: well approximated median episiotomy, (+) ecchymosis, (+) edema, (+) external hemorrhoid noted. Ice pack pad in place  Lochia: small rubra on pad  Extremities: trace LE edema, no calf pain or tenderness    A: PPD # 1, VAVD  Median episiotomy (2nd degree no extension)  ABL Anemia - stable, asymptomatic  External hemorrhoid    Doing well - stable status  P: Routine post partum orders  Proctofoam BID and Tucks pads  Discharge home today  WOB discharge book given, instructions  and warning s/s reviewed   Perineal care reviewed   F/u with Alden Server, CNM in 6 weeks   Lars Pinks, MSN, CNM [facility]

## 2019-06-21 ENCOUNTER — Inpatient Hospital Stay (HOSPITAL_COMMUNITY): Admission: AD | Admit: 2019-06-21 | Payer: 59 | Source: Home / Self Care

## 2019-09-10 ENCOUNTER — Other Ambulatory Visit: Payer: Self-pay

## 2019-09-10 ENCOUNTER — Other Ambulatory Visit (INDEPENDENT_AMBULATORY_CARE_PROVIDER_SITE_OTHER): Payer: 59

## 2019-09-10 DIAGNOSIS — Z23 Encounter for immunization: Secondary | ICD-10-CM

## 2019-09-10 DIAGNOSIS — Z0000 Encounter for general adult medical examination without abnormal findings: Secondary | ICD-10-CM

## 2019-09-10 NOTE — Progress Notes (Signed)
Pt here for influenza vaccine.  Screening questionnaire reviewed, VIS provided to patient, and any/all patient questions answered.  T. Nelson, CMA  

## 2019-09-10 NOTE — Addendum Note (Signed)
Addended by: Fonnie Mu on: 09/10/2019 08:54 AM   Modules accepted: Orders

## 2019-09-11 LAB — COMPREHENSIVE METABOLIC PANEL
ALT: 16 IU/L (ref 0–32)
AST: 18 IU/L (ref 0–40)
Albumin/Globulin Ratio: 1.5 (ref 1.2–2.2)
Albumin: 4.6 g/dL (ref 3.9–5.0)
Alkaline Phosphatase: 124 IU/L — ABNORMAL HIGH (ref 39–117)
BUN/Creatinine Ratio: 14 (ref 9–23)
BUN: 14 mg/dL (ref 6–20)
Bilirubin Total: 0.7 mg/dL (ref 0.0–1.2)
CO2: 25 mmol/L (ref 20–29)
Calcium: 9.7 mg/dL (ref 8.7–10.2)
Chloride: 100 mmol/L (ref 96–106)
Creatinine, Ser: 1.02 mg/dL — ABNORMAL HIGH (ref 0.57–1.00)
GFR calc Af Amer: 85 mL/min/{1.73_m2} (ref >59)
GFR calc non Af Amer: 74 mL/min/{1.73_m2} (ref >59)
Globulin, Total: 3 g/dL (ref 1.5–4.5)
Glucose: 83 mg/dL (ref 65–99)
Potassium: 4.9 mmol/L (ref 3.5–5.2)
Sodium: 137 mmol/L (ref 134–144)
Total Protein: 7.6 g/dL (ref 6.0–8.5)

## 2019-09-11 LAB — CBC WITH DIFFERENTIAL/PLATELET
Basophils Absolute: 0.1 10*3/uL (ref 0.0–0.2)
Basos: 1 %
EOS (ABSOLUTE): 0.3 10*3/uL (ref 0.0–0.4)
Eos: 5 %
Hematocrit: 40.5 % (ref 34.0–46.6)
Hemoglobin: 14.2 g/dL (ref 11.1–15.9)
Immature Grans (Abs): 0 10*3/uL (ref 0.0–0.1)
Immature Granulocytes: 0 %
Lymphocytes Absolute: 2.4 10*3/uL (ref 0.7–3.1)
Lymphs: 42 %
MCH: 31.3 pg (ref 26.6–33.0)
MCHC: 35.1 g/dL (ref 31.5–35.7)
MCV: 89 fL (ref 79–97)
Monocytes Absolute: 0.5 10*3/uL (ref 0.1–0.9)
Monocytes: 9 %
Neutrophils Absolute: 2.4 10*3/uL (ref 1.4–7.0)
Neutrophils: 43 %
Platelets: 297 10*3/uL (ref 150–450)
RBC: 4.54 x10E6/uL (ref 3.77–5.28)
RDW: 11.8 % (ref 11.7–15.4)
WBC: 5.7 10*3/uL (ref 3.4–10.8)

## 2019-09-11 LAB — LIPID PANEL
Chol/HDL Ratio: 2.4 ratio (ref 0.0–4.4)
Cholesterol, Total: 150 mg/dL (ref 100–199)
HDL: 62 mg/dL (ref >39)
LDL Chol Calc (NIH): 75 mg/dL (ref 0–99)
Triglycerides: 62 mg/dL (ref 0–149)
VLDL Cholesterol Cal: 13 mg/dL (ref 5–40)

## 2019-09-11 LAB — TSH: TSH: 1.14 u[IU]/mL (ref 0.450–4.500)

## 2019-09-11 LAB — HEMOGLOBIN A1C
Est. average glucose Bld gHb Est-mCnc: 88 mg/dL
Hgb A1c MFr Bld: 4.7 % — ABNORMAL LOW (ref 4.8–5.6)

## 2019-09-14 NOTE — Progress Notes (Signed)
Subjective:    Patient ID: Meagan Torres, female    DOB: 1986-03-19, 33 y.o.   MRN: [id number]  HPI:  Ms. Paulman is here for CPE Ms. Hunnell delivered a child 05/2019 She is currently breastfeeding her 25 month old daughter "Elenor". She estimates to drink 40-50 oz water/day She reports doing several hours in between meals/snacks, however denies episodes of hypoglycemia. She reports eating very healthy, estimates 2-3 cocktails/week. 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  09/10/2019 Labs: CMP-stable  CBC-stable  TSH-WNL, 1.140  Lipid-  Total-150  TGs-62  HDL-62  LDL-75   Healthcare Maintenance: PAP-per pt last 10/2018- normal - will request records again Immunizations-UTD  Patient Care Team    Relationship Specialty Notifications Start End  Esaw Grandchild, NP PCP - General Family Medicine  08/08/18   Dermatology, [facility]    08/08/18   Obgyn, [facility]    08/08/18     Patient Active Problem List   Diagnosis Date Noted  . Hypoglycemia 09/16/2019  . Elevated serum alkaline phosphatase level 09/16/2019  . Postpartum care following VAVD (8/23) 06/17/2019  . Term pregnancy 06/16/2019  . Vacuum-assisted vaginal delivery 06/16/2019  . Healthcare maintenance 08/08/2018  . Labor abnormality 02/15/2018  . Fetal demise, greater than 22 weeks, antepartum, fetus 1 02/15/2018  . Cystic hygroma of fetus in singleton pregnancy 12/28/2017  . Fetal hydrops 12/28/2017  . [redacted] weeks gestation of pregnancy   . Vacuum extraction, delivered, current hospitalization 12/21/2014  . Third degree perineal laceration, delivered, current hospitalization 12/21/2014     Past Medical History:  Diagnosis Date  . Asthma      Past Surgical History:  Procedure Laterality Date  . BASAL CELL CARCINOMA EXCISION    . NO PAST SURGERIES    . WISDOM TOOTH EXTRACTION       Family History  Problem Relation Age of Onset  .  Hypertension Mother   . Cancer Father        melanoma  . Cancer Maternal Grandmother        breast  . Hypertension Maternal Grandmother   . Cancer Maternal Grandfather        lung  . Stroke Paternal Grandfather      Social History   Substance and Sexual Activity  Drug Use No     Social History   Substance and Sexual Activity  Alcohol Use Yes   Comment: 1-2 per month     Social History   Tobacco Use  Smoking Status Never Smoker  Smokeless Tobacco Never Used     Outpatient Encounter Medications as of 09/16/2019  Medication Sig  . albuterol (PROAIR HFA) 108 (90 Base) MCG/ACT inhaler Inhale 2 puffs into the lungs every 6 (six) hours as needed.  . Prenatal Vit-Fe Fumarate-FA (PRENATAL MULTIVITAMIN) TABS tablet Take 1 tablet by mouth daily at 12 noon.  . [DISCONTINUED] hydrocortisone-pramoxine (PROCTOFOAM-HC) rectal foam Place 1 applicator rectally 2 (two) times daily.  . [DISCONTINUED] ibuprofen (ADVIL) 600 MG tablet Take 1 tablet (600 mg total) by mouth every 6 (six) hours.   No facility-administered encounter medications on file as of 09/16/2019.     Allergies: Patient has no known allergies.  Body mass index is 22.73 kg/m.  Blood pressure 120/80, pulse 80, temperature 98.2 F (36.8 C), temperature source Oral, height 5' 1.5" (1.562 m), weight 122 lb 4.8 oz (55.5 kg), SpO2 98 %, currently breastfeeding.  Review of Systems  Constitutional: Positive for fatigue. Negative for activity change, appetite change, chills, diaphoresis, fever and unexpected weight change.  HENT: Negative for congestion.   Eyes: Negative for visual disturbance.  Respiratory: Negative for cough, chest tightness, shortness of breath, wheezing and stridor.   Cardiovascular: Negative for chest pain, palpitations and leg swelling.  Gastrointestinal: Negative for abdominal distention, anal bleeding, blood in stool, constipation, diarrhea, nausea and vomiting.  Endocrine: Negative for  polydipsia, polyphagia and polyuria.  Genitourinary: Negative for difficulty urinating and flank pain.  Musculoskeletal: Negative for arthralgias, back pain, gait problem, joint swelling, myalgias, neck pain and neck stiffness.  Skin: Negative for color change, pallor, rash and wound.  Neurological: Negative for dizziness and headaches.  Hematological: Negative for adenopathy. Does not bruise/bleed easily.  Psychiatric/Behavioral: Negative for agitation, behavioral problems, confusion, decreased concentration, dysphoric mood, hallucinations, self-injury, sleep disturbance and suicidal ideas. The patient is not nervous/anxious and is not hyperactive.        Objective:   Physical Exam Vitals signs and nursing note reviewed.  Constitutional:      General: She is not in acute distress.    Appearance: Normal appearance. She is normal weight. She is not ill-appearing, toxic-appearing or diaphoretic.  HENT:     Head: Normocephalic and atraumatic.     Right Ear: Tympanic membrane, ear canal and external ear normal. There is no impacted cerumen.     Left Ear: Tympanic membrane, ear canal and external ear normal. There is no impacted cerumen.     Nose: Nose normal. No congestion.     Mouth/Throat:     Mouth: Mucous membranes are moist.     Pharynx: No oropharyngeal exudate.  Eyes:     Extraocular Movements: Extraocular movements intact.     Conjunctiva/sclera: Conjunctivae normal.     Pupils: Pupils are equal, round, and reactive to light.  Neck:     Musculoskeletal: Normal range of motion and neck supple. No muscular tenderness.  Cardiovascular:     Rate and Rhythm: Normal rate and regular rhythm.     Pulses: Normal pulses.     Heart sounds: Normal heart sounds. No murmur. No friction rub. No gallop.   Pulmonary:     Effort: Pulmonary effort is normal. No respiratory distress.     Breath sounds: Normal breath sounds. No stridor. No wheezing, rhonchi or rales.  Chest:     Chest wall: No  tenderness.  Abdominal:     General: Abdomen is flat. Bowel sounds are normal.     Palpations: Abdomen is soft.     Tenderness: There is no abdominal tenderness. There is no right CVA tenderness or left CVA tenderness.  Musculoskeletal: Normal range of motion.        General: No tenderness.  Skin:    General: Skin is warm and dry.     Capillary Refill: Capillary refill takes less than 2 seconds.  Neurological:     Mental Status: She is alert and oriented to person, place, and time.     Coordination: Coordination normal.  Psychiatric:        Mood and Affect: Mood normal.        Behavior: Behavior normal.        Thought Content: Thought content normal.        Judgment: Judgment normal.       Assessment & Plan:   1. Healthcare maintenance   2. Hypoglycemia   3. Elevated serum alkaline phosphatase level     Healthcare maintenance Your blood  pressure and weight are wonderful. Most of your recent labs look very good. Recommended re-checking A1c is a little low (average of your blood sugar over 3 month period) and metabolic panel in 6 months (slight elevation in Alkaline Phosphatase). Make sure that you are eating every few hours, especially since you are breastfeeding.  Remain well hydrated and follow heart healthy diet. Keep annual appt with OB/GYN Jan 20201. If your husband needs referral to Urologist, re: vasectomy- please call clinic so that we can place referral. Continue to social distance and wear a mask. Recommend annual physical with fasting labs.  Hypoglycemia Lab Results  Component Value Date   HGBA1C 4.7 (L) 09/10/2019   HGBA1C 4.8 09/05/2018   Encouraged to eat very few hours Re-peat A1c in 6months  Elevated serum alkaline phosphatase level Denies GI sx's Denies heavy use of hepatic toxic substances Body mass index is 22.73 kg/m.  Repeat CMP in 6 months     FOLLOW-UP:  Return in about 1 year (around 09/15/2020) for CPE, Fasting Labs.

## 2019-09-16 ENCOUNTER — Encounter: Payer: Self-pay | Admitting: Adult Health

## 2019-09-16 ENCOUNTER — Ambulatory Visit (INDEPENDENT_AMBULATORY_CARE_PROVIDER_SITE_OTHER): Payer: 59 | Admitting: Adult Health

## 2019-09-16 ENCOUNTER — Other Ambulatory Visit: Payer: Self-pay

## 2019-09-16 DIAGNOSIS — R748 Abnormal levels of other serum enzymes: Secondary | ICD-10-CM

## 2019-09-16 DIAGNOSIS — Z0000 Encounter for general adult medical examination without abnormal findings: Secondary | ICD-10-CM

## 2019-09-16 DIAGNOSIS — E162 Hypoglycemia, unspecified: Secondary | ICD-10-CM

## 2019-09-16 HISTORY — DX: Hypoglycemia, unspecified: E16.2

## 2019-09-16 HISTORY — DX: Abnormal levels of other serum enzymes: R74.8

## 2019-09-16 NOTE — Assessment & Plan Note (Signed)
Your blood pressure and weight are wonderful. Most of your recent labs look very good. Recommended re-checking A1c is a little low (average of your blood sugar over 3 month period) and metabolic panel in 6 months (slight elevation in Alkaline Phosphatase). Make sure that you are eating every few hours, especially since you are breastfeeding.  Remain well hydrated and follow heart healthy diet. Keep annual appt with OB/GYN Jan 20201. If your husband needs referral to Urologist, re: vasectomy- please call clinic so that we can place referral. Continue to social distance and wear a mask. Recommend annual physical with fasting labs.

## 2019-09-16 NOTE — Assessment & Plan Note (Signed)
Lab Results  Component Value Date   HGBA1C 4.7 (L) 09/10/2019   HGBA1C 4.8 09/05/2018   Encouraged to eat very few hours Re-peat A1c in 34months

## 2019-09-16 NOTE — Assessment & Plan Note (Signed)
Denies GI sx's Denies heavy use of hepatic toxic substances Body mass index is 22.73 kg/m.  Repeat CMP in 6 months

## 2019-09-16 NOTE — Patient Instructions (Signed)
Preventive Care for Adults, Female  A healthy lifestyle and preventive care can promote health and wellness. Preventive health guidelines for women include the following key practices.   A routine yearly physical is a good way to check with your health care provider about your health and preventive screening. It is a chance to share any concerns and updates on your health and to receive a thorough exam.   Visit your dentist for a routine exam and preventive care every 6 months. Brush your teeth twice a day and floss once a day. Good oral hygiene prevents tooth decay and gum disease.   The frequency of eye exams is based on your age, health, family medical history, use of contact lenses, and other factors. Follow your health care provider's recommendations for frequency of eye exams.   Eat a healthy diet. Foods like vegetables, fruits, whole grains, low-fat dairy products, and lean protein foods contain the nutrients you need without too many calories. Decrease your intake of foods high in solid fats, added sugars, and salt. Eat the right amount of calories for you.Get information about a proper diet from your health care provider, if necessary.   Regular physical exercise is one of the most important things you can do for your health. Most adults should get at least 150 minutes of moderate-intensity exercise (any activity that increases your heart rate and causes you to sweat) each week. In addition, most adults need muscle-strengthening exercises on 2 or more days a week.   Maintain a healthy weight. The body mass index (BMI) is a screening tool to identify possible weight problems. It provides an estimate of body fat based on height and weight. Your health care provider can find your BMI, and can help you achieve or maintain a healthy weight.For adults 20 years and older:   - A BMI below 18.5 is considered underweight.   - A BMI of 18.5 to 24.9 is normal.   - A BMI of 25 to 29.9 is  considered overweight.   - A BMI of 30 and above is considered obese.   Maintain normal blood lipids and cholesterol levels by exercising and minimizing your intake of trans and saturated fats.  Eat a balanced diet with plenty of fruit and vegetables. Blood tests for lipids and cholesterol should begin at age 20 and be repeated every 5 years minimum.  If your lipid or cholesterol levels are high, you are over 40, or you are at high risk for heart disease, you may need your cholesterol levels checked more frequently.Ongoing high lipid and cholesterol levels should be treated with medicines if diet and exercise are not working.   If you smoke, find out from your health care provider how to quit. If you do not use tobacco, do not start.   Lung cancer screening is recommended for adults aged 55-80 years who are at high risk for developing lung cancer because of a history of smoking. A yearly low-dose CT scan of the lungs is recommended for people who have at least a 30-pack-year history of smoking and are a current smoker or have quit within the past 15 years. A pack year of smoking is smoking an average of 1 pack of cigarettes a day for 1 year (for example: 1 pack a day for 30 years or 2 packs a day for 15 years). Yearly screening should continue until the smoker has stopped smoking for at least 15 years. Yearly screening should be stopped for people who develop a   health problem that would prevent them from having lung cancer treatment.   If you are pregnant, do not drink alcohol. If you are breastfeeding, be very cautious about drinking alcohol. If you are not pregnant and choose to drink alcohol, do not have more than 1 drink per day. One drink is considered to be 12 ounces (355 mL) of beer, 5 ounces (148 mL) of wine, or 1.5 ounces (44 mL) of liquor.   Avoid use of street drugs. Do not share needles with anyone. Ask for help if you need support or instructions about stopping the use of  drugs.   High blood pressure causes heart disease and increases the risk of stroke. Your blood pressure should be checked at least yearly.  Ongoing high blood pressure should be treated with medicines if weight loss and exercise do not work.   If you are 69-55 years old, ask your health care provider if you should take aspirin to prevent strokes.   Diabetes screening involves taking a blood sample to check your fasting blood sugar level. This should be done once every 3 years, after age 38, if you are within normal weight and without risk factors for diabetes. Testing should be considered at a younger age or be carried out more frequently if you are overweight and have at least 1 risk factor for diabetes.   Breast cancer screening is essential preventive care for women. You should practice "breast self-awareness."  This means understanding the normal appearance and feel of your breasts and may include breast self-examination.  Any changes detected, no matter how small, should be reported to a health care provider.  Women in their 80s and 30s should have a clinical breast exam (CBE) by a health care provider as part of a regular health exam every 1 to 3 years.  After age 66, women should have a CBE every year.  Starting at age 1, women should consider having a mammogram (breast X-ray test) every year.  Women who have a family history of breast cancer should talk to their health care provider about genetic screening.  Women at a high risk of breast cancer should talk to their health care providers about having an MRI and a mammogram every year.   -Breast cancer gene (BRCA)-related cancer risk assessment is recommended for women who have family members with BRCA-related cancers. BRCA-related cancers include breast, ovarian, tubal, and peritoneal cancers. Having family members with these cancers may be associated with an increased risk for harmful changes (mutations) in the breast cancer genes BRCA1 and  BRCA2. Results of the assessment will determine the need for genetic counseling and BRCA1 and BRCA2 testing.   The Pap test is a screening test for cervical cancer. A Pap test can show cell changes on the cervix that might become cervical cancer if left untreated. A Pap test is a procedure in which cells are obtained and examined from the lower end of the uterus (cervix).   - Women should have a Pap test starting at age 57.   - Between ages 90 and 70, Pap tests should be repeated every 2 years.   - Beginning at age 63, you should have a Pap test every 3 years as long as the past 3 Pap tests have been normal.   - Some women have medical problems that increase the chance of getting cervical cancer. Talk to your health care provider about these problems. It is especially important to talk to your health care provider if a  new problem develops soon after your last Pap test. In these cases, your health care provider may recommend more frequent screening and Pap tests.   - The above recommendations are the same for women who have or have not gotten the vaccine for human papillomavirus (HPV).   - If you had a hysterectomy for a problem that was not cancer or a condition that could lead to cancer, then you no longer need Pap tests. Even if you no longer need a Pap test, a regular exam is a good idea to make sure no other problems are starting.   - If you are between ages 36 and 66 years, and you have had normal Pap tests going back 10 years, you no longer need Pap tests. Even if you no longer need a Pap test, a regular exam is a good idea to make sure no other problems are starting.   - If you have had past treatment for cervical cancer or a condition that could lead to cancer, you need Pap tests and screening for cancer for at least 20 years after your treatment.   - If Pap tests have been discontinued, risk factors (such as a new sexual partner) need to be reassessed to determine if screening should  be resumed.   - The HPV test is an additional test that may be used for cervical cancer screening. The HPV test looks for the virus that can cause the cell changes on the cervix. The cells collected during the Pap test can be tested for HPV. The HPV test could be used to screen women aged 70 years and older, and should be used in women of any age who have unclear Pap test results. After the age of 67, women should have HPV testing at the same frequency as a Pap test.   Colorectal cancer can be detected and often prevented. Most routine colorectal cancer screening begins at the age of 57 years and continues through age 26 years. However, your health care provider may recommend screening at an earlier age if you have risk factors for colon cancer. On a yearly basis, your health care provider may provide home test kits to check for hidden blood in the stool.  Use of a small camera at the end of a tube, to directly examine the colon (sigmoidoscopy or colonoscopy), can detect the earliest forms of colorectal cancer. Talk to your health care provider about this at age 23, when routine screening begins. Direct exam of the colon should be repeated every 5 -10 years through age 49 years, unless early forms of pre-cancerous polyps or small growths are found.   People who are at an increased risk for hepatitis B should be screened for this virus. You are considered at high risk for hepatitis B if:  -You were born in a country where hepatitis B occurs often. Talk with your health care provider about which countries are considered high risk.  - Your parents were born in a high-risk country and you have not received a shot to protect against hepatitis B (hepatitis B vaccine).  - You have HIV or AIDS.  - You use needles to inject street drugs.  - You live with, or have sex with, someone who has Hepatitis B.  - You get hemodialysis treatment.  - You take certain medicines for conditions like cancer, organ  transplantation, and autoimmune conditions.   Hepatitis C blood testing is recommended for all people born from [birth year] through 1965 and any individual  with known risks for hepatitis C.   Practice safe sex. Use condoms and avoid high-risk sexual practices to reduce the spread of sexually transmitted infections (STIs). STIs include gonorrhea, chlamydia, syphilis, trichomonas, herpes, HPV, and human immunodeficiency virus (HIV). Herpes, HIV, and HPV are viral illnesses that have no cure. They can result in disability, cancer, and death. Sexually active women aged 25 years and younger should be checked for chlamydia. Older women with new or multiple partners should also be tested for chlamydia. Testing for other STIs is recommended if you are sexually active and at increased risk.   Osteoporosis is a disease in which the bones lose minerals and strength with aging. This can result in serious bone fractures or breaks. The risk of osteoporosis can be identified using a bone density scan. Women ages 65 years and over and women at risk for fractures or osteoporosis should discuss screening with their health care providers. Ask your health care provider whether you should take a calcium supplement or vitamin D to There are also several preventive steps women can take to avoid osteoporosis and resulting fractures or to keep osteoporosis from worsening. -->Recommendations include:  Eat a balanced diet high in fruits, vegetables, calcium, and vitamins.  Get enough calcium. The recommended total intake of is 1,200 mg daily; for best absorption, if taking supplements, divide doses into 250-500 mg doses throughout the day. Of the two types of calcium, calcium carbonate is best absorbed when taken with food but calcium citrate can be taken on an empty stomach.  Get enough vitamin D. NAMS and the National Osteoporosis Foundation recommend at least 1,000 IU per day for women age 50 and over who are at risk of vitamin D  deficiency. Vitamin D deficiency can be caused by inadequate sun exposure (for example, those who live in northern latitudes).  Avoid alcohol and smoking. Heavy alcohol intake (more than 7 drinks per week) increases the risk of falls and hip fracture and women smokers tend to lose bone more rapidly and have lower bone mass than nonsmokers. Stopping smoking is one of the most important changes women can make to improve their health and decrease risk for disease.  Be physically active every day. Weight-bearing exercise (for example, fast walking, hiking, jogging, and weight training) may strengthen bones or slow the rate of bone loss that comes with aging. Balancing and muscle-strengthening exercises can reduce the risk of falling and fracture.  Consider therapeutic medications. Currently, several types of effective drugs are available. Healthcare providers can recommend the type most appropriate for each woman.  Eliminate environmental factors that may contribute to accidents. Falls cause nearly 90% of all osteoporotic fractures, so reducing this risk is an important bone-health strategy. Measures include ample lighting, removing obstructions to walking, using nonskid rugs on floors, and placing mats and/or grab bars in showers.  Be aware of medication side effects. Some common medicines make bones weaker. These include a type of steroid drug called glucocorticoids used for arthritis and asthma, some antiseizure drugs, certain sleeping pills, treatments for endometriosis, and some cancer drugs. An overactive thyroid gland or using too much thyroid hormone for an underactive thyroid can also be a problem. If you are taking these medicines, talk to your doctor about what you can do to help protect your bones.reduce the rate of osteoporosis.    Menopause can be associated with physical symptoms and risks. Hormone replacement therapy is available to decrease symptoms and risks. You should talk to your  health care provider   about whether hormone replacement therapy is right for you.   Use sunscreen. Apply sunscreen liberally and repeatedly throughout the day. You should seek shade when your shadow is shorter than you. Protect yourself by wearing long sleeves, pants, a wide-brimmed hat, and sunglasses year round, whenever you are outdoors.   Once a month, do a whole body skin exam, using a mirror to look at the skin on your back. Tell your health care provider of new moles, moles that have irregular borders, moles that are larger than a pencil eraser, or moles that have changed in shape or color.   -Stay current with required vaccines (immunizations).   Influenza vaccine. All adults should be immunized every year.  Tetanus, diphtheria, and acellular pertussis (Td, Tdap) vaccine. Pregnant women should receive 1 dose of Tdap vaccine during each pregnancy. The dose should be obtained regardless of the length of time since the last dose. Immunization is preferred during the 27th 36th week of gestation. An adult who has not previously received Tdap or who does not know her vaccine status should receive 1 dose of Tdap. This initial dose should be followed by tetanus and diphtheria toxoids (Td) booster doses every 10 years. Adults with an unknown or incomplete history of completing a 3-dose immunization series with Td-containing vaccines should begin or complete a primary immunization series including a Tdap dose. Adults should receive a Td booster every 10 years.  Varicella vaccine. An adult without evidence of immunity to varicella should receive 2 doses or a second dose if she has previously received 1 dose. Pregnant females who do not have evidence of immunity should receive the first dose after pregnancy. This first dose should be obtained before leaving the health care facility. The second dose should be obtained 4 8 weeks after the first dose.  Human papillomavirus (HPV) vaccine. Females aged 13 26  years who have not received the vaccine previously should obtain the 3-dose series. The vaccine is not recommended for use in pregnant females. However, pregnancy testing is not needed before receiving a dose. If a female is found to be pregnant after receiving a dose, no treatment is needed. In that case, the remaining doses should be delayed until after the pregnancy. Immunization is recommended for any person with an immunocompromised condition through the age of 26 years if she did not get any or all doses earlier. During the 3-dose series, the second dose should be obtained 4 8 weeks after the first dose. The third dose should be obtained 24 weeks after the first dose and 16 weeks after the second dose.  Zoster vaccine. One dose is recommended for adults aged 60 years or older unless certain conditions are present.  Measles, mumps, and rubella (MMR) vaccine. Adults born before 1957 generally are considered immune to measles and mumps. Adults born in 1957 or later should have 1 or more doses of MMR vaccine unless there is a contraindication to the vaccine or there is laboratory evidence of immunity to each of the three diseases. A routine second dose of MMR vaccine should be obtained at least 28 days after the first dose for students attending postsecondary schools, health care workers, or international travelers. People who received inactivated measles vaccine or an unknown type of measles vaccine during 1963 1967 should receive 2 doses of MMR vaccine. People who received inactivated mumps vaccine or an unknown type of mumps vaccine before 1979 and are at high risk for mumps infection should consider immunization with 2 doses of   MMR vaccine. For females of childbearing age, rubella immunity should be determined. If there is no evidence of immunity, females who are not pregnant should be vaccinated. If there is no evidence of immunity, females who are pregnant should delay immunization until after pregnancy.  Unvaccinated health care workers born before [birth year] who lack laboratory evidence of measles, mumps, or rubella immunity or laboratory confirmation of disease should consider measles and mumps immunization with 2 doses of MMR vaccine or rubella immunization with 1 dose of MMR vaccine.  Pneumococcal 13-valent conjugate (PCV13) vaccine. When indicated, a person who is uncertain of her immunization history and has no record of immunization should receive the PCV13 vaccine. An adult aged 54 years or older who has certain medical conditions and has not been previously immunized should receive 1 dose of PCV13 vaccine. This PCV13 should be followed with a dose of pneumococcal polysaccharide (PPSV23) vaccine. The PPSV23 vaccine dose should be obtained at least 8 weeks after the dose of PCV13 vaccine. An adult aged 58 years or older who has certain medical conditions and previously received 1 or more doses of PPSV23 vaccine should receive 1 dose of PCV13. The PCV13 vaccine dose should be obtained 1 or more years after the last PPSV23 vaccine dose.  Pneumococcal polysaccharide (PPSV23) vaccine. When PCV13 is also indicated, PCV13 should be obtained first. All adults aged 58 years and older should be immunized. An adult younger than age 65 years who has certain medical conditions should be immunized. Any person who resides in a nursing home or long-term care facility should be immunized. An adult smoker should be immunized. People with an immunocompromised condition and certain other conditions should receive both PCV13 and PPSV23 vaccines. People with human immunodeficiency virus (HIV) infection should be immunized as soon as possible after diagnosis. Immunization during chemotherapy or radiation therapy should be avoided. Routine use of PPSV23 vaccine is not recommended for American Indians, [place] Natives, or people younger than 65 years unless there are medical conditions that require PPSV23 vaccine. When indicated,  people who have unknown immunization and have no record of immunization should receive PPSV23 vaccine. One-time revaccination 5 years after the first dose of PPSV23 is recommended for people aged 70 64 years who have chronic kidney failure, nephrotic syndrome, asplenia, or immunocompromised conditions. People who received 1 2 doses of PPSV23 before age 32 years should receive another dose of PPSV23 vaccine at age 96 years or later if at least 5 years have passed since the previous dose. Doses of PPSV23 are not needed for people immunized with PPSV23 at or after age 55 years.  Meningococcal vaccine. Adults with asplenia or persistent complement component deficiencies should receive 2 doses of quadrivalent meningococcal conjugate (MenACWY-D) vaccine. The doses should be obtained at least 2 months apart. Microbiologists working with certain meningococcal bacteria, [employer] recruits, people at risk during an outbreak, and people who travel to or live in countries with a high rate of meningitis should be immunized. A first-year college student up through age 58 years who is living in a residence hall should receive a dose if she did not receive a dose on or after her 16th birthday. Adults who have certain high-risk conditions should receive one or more doses of vaccine.  Hepatitis A vaccine. Adults who wish to be protected from this disease, have certain high-risk conditions, work with hepatitis A-infected animals, work in hepatitis A research labs, or travel to or work in countries with a high rate of hepatitis A should be  immunized. Adults who were previously unvaccinated and who anticipate close contact with an international adoptee during the first 60 days after arrival in the [place] from a country with a high rate of hepatitis A should be immunized.  Hepatitis B vaccine.  Adults who wish to be protected from this disease, have certain high-risk conditions, may be exposed to blood or other infectious  body fluids, are household contacts or sex partners of hepatitis B positive people, are clients or workers in certain care facilities, or travel to or work in countries with a high rate of hepatitis B should be immunized.  Haemophilus influenzae type b (Hib) vaccine. A previously unvaccinated person with asplenia or sickle cell disease or having a scheduled splenectomy should receive 1 dose of Hib vaccine. Regardless of previous immunization, a recipient of a hematopoietic stem cell transplant should receive a 3-dose series 6 12 months after her successful transplant. Hib vaccine is not recommended for adults with HIV infection.  Preventive Services / Frequency Ages 6 to 39years  Blood pressure check.** / Every 1 to 2 years.  Lipid and cholesterol check.** / Every 5 years beginning at age 39.  Clinical breast exam.** / Every 3 years for women in their 61s and 62s.  BRCA-related cancer risk assessment.** / For women who have family members with a BRCA-related cancer (breast, ovarian, tubal, or peritoneal cancers).  Pap test.** / Every 2 years from ages 47 through 85. Every 3 years starting at age 34 through age 12 or 74 with a history of 3 consecutive normal Pap tests.  HPV screening.** / Every 3 years from ages 46 through ages 43 to 54 with a history of 3 consecutive normal Pap tests.  Hepatitis C blood test.** / For any individual with known risks for hepatitis C.  Skin self-exam. / Monthly.  Influenza vaccine. / Every year.  Tetanus, diphtheria, and acellular pertussis (Tdap, Td) vaccine.** / Consult your health care provider. Pregnant women should receive 1 dose of Tdap vaccine during each pregnancy. 1 dose of Td every 10 years.  Varicella vaccine.** / Consult your health care provider. Pregnant females who do not have evidence of immunity should receive the first dose after pregnancy.  HPV vaccine. / 3 doses over 6 months, if 64 and younger. The vaccine is not recommended for use in  pregnant females. However, pregnancy testing is not needed before receiving a dose.  Measles, mumps, rubella (MMR) vaccine.** / You need at least 1 dose of MMR if you were born in 1957 or later. You may also need a 2nd dose. For females of childbearing age, rubella immunity should be determined. If there is no evidence of immunity, females who are not pregnant should be vaccinated. If there is no evidence of immunity, females who are pregnant should delay immunization until after pregnancy.  Pneumococcal 13-valent conjugate (PCV13) vaccine.** / Consult your health care provider.  Pneumococcal polysaccharide (PPSV23) vaccine.** / 1 to 2 doses if you smoke cigarettes or if you have certain conditions.  Meningococcal vaccine.** / 1 dose if you are age 71 to 39 years and a Market researcher living in a residence hall, or have one of several medical conditions, you need to get vaccinated against meningococcal disease. You may also need additional booster doses.  Hepatitis A vaccine.** / Consult your health care provider.  Hepatitis B vaccine.** / Consult your health care provider.  Haemophilus influenzae type b (Hib) vaccine.** / Consult your health care provider.  Ages 55 to 64years  Blood pressure check.** / Every 1 to 2 years.  Lipid and cholesterol check.** / Every 5 years beginning at age 20 years.  Lung cancer screening. / Every year if you are aged 55 80 years and have a 30-pack-year history of smoking and currently smoke or have quit within the past 15 years. Yearly screening is stopped once you have quit smoking for at least 15 years or develop a health problem that would prevent you from having lung cancer treatment.  Clinical breast exam.** / Every year after age 40 years.  BRCA-related cancer risk assessment.** / For women who have family members with a BRCA-related cancer (breast, ovarian, tubal, or peritoneal cancers).  Mammogram.** / Every year beginning at age 40  years and continuing for as long as you are in good health. Consult with your health care provider.  Pap test.** / Every 3 years starting at age 30 years through age 65 or 70 years with a history of 3 consecutive normal Pap tests.  HPV screening.** / Every 3 years from ages 30 years through ages 65 to 70 years with a history of 3 consecutive normal Pap tests.  Fecal occult blood test (FOBT) of stool. / Every year beginning at age 50 years and continuing until age 75 years. You may not need to do this test if you get a colonoscopy every 10 years.  Flexible sigmoidoscopy or colonoscopy.** / Every 5 years for a flexible sigmoidoscopy or every 10 years for a colonoscopy beginning at age 50 years and continuing until age 75 years.  Hepatitis C blood test.** / For all people born from 1945 through 1965 and any individual with known risks for hepatitis C.  Skin self-exam. / Monthly.  Influenza vaccine. / Every year.  Tetanus, diphtheria, and acellular pertussis (Tdap/Td) vaccine.** / Consult your health care provider. Pregnant women should receive 1 dose of Tdap vaccine during each pregnancy. 1 dose of Td every 10 years.  Varicella vaccine.** / Consult your health care provider. Pregnant females who do not have evidence of immunity should receive the first dose after pregnancy.  Zoster vaccine.** / 1 dose for adults aged 60 years or older.  Measles, mumps, rubella (MMR) vaccine.** / You need at least 1 dose of MMR if you were born in 1957 or later. You may also need a 2nd dose. For females of childbearing age, rubella immunity should be determined. If there is no evidence of immunity, females who are not pregnant should be vaccinated. If there is no evidence of immunity, females who are pregnant should delay immunization until after pregnancy.  Pneumococcal 13-valent conjugate (PCV13) vaccine.** / Consult your health care provider.  Pneumococcal polysaccharide (PPSV23) vaccine.** / 1 to 2 doses if  you smoke cigarettes or if you have certain conditions.  Meningococcal vaccine.** / Consult your health care provider.  Hepatitis A vaccine.** / Consult your health care provider.  Hepatitis B vaccine.** / Consult your health care provider.  Haemophilus influenzae type b (Hib) vaccine.** / Consult your health care provider.  Ages 65 years and over  Blood pressure check.** / Every 1 to 2 years.  Lipid and cholesterol check.** / Every 5 years beginning at age 20 years.  Lung cancer screening. / Every year if you are aged 55 80 years and have a 30-pack-year history of smoking and currently smoke or have quit within the past 15 years. Yearly screening is stopped once you have quit smoking for at least 15 years or develop a health problem that   would prevent you from having lung cancer treatment.  Clinical breast exam.** / Every year after age 103 years.  BRCA-related cancer risk assessment.** / For women who have family members with a BRCA-related cancer (breast, ovarian, tubal, or peritoneal cancers).  Mammogram.** / Every year beginning at age 36 years and continuing for as long as you are in good health. Consult with your health care provider.  Pap test.** / Every 3 years starting at age 5 years through age 85 or 10 years with 3 consecutive normal Pap tests. Testing can be stopped between 65 and 70 years with 3 consecutive normal Pap tests and no abnormal Pap or HPV tests in the past 10 years.  HPV screening.** / Every 3 years from ages 93 years through ages 70 or 45 years with a history of 3 consecutive normal Pap tests. Testing can be stopped between 65 and 70 years with 3 consecutive normal Pap tests and no abnormal Pap or HPV tests in the past 10 years.  Fecal occult blood test (FOBT) of stool. / Every year beginning at age 8 years and continuing until age 45 years. You may not need to do this test if you get a colonoscopy every 10 years.  Flexible sigmoidoscopy or colonoscopy.** /  Every 5 years for a flexible sigmoidoscopy or every 10 years for a colonoscopy beginning at age 69 years and continuing until age 68 years.  Hepatitis C blood test.** / For all people born from [birth year] through 1963 and any individual with known risks for hepatitis C.  Osteoporosis screening.** / A one-time screening for women ages 7 years and over and women at risk for fractures or osteoporosis.  Skin self-exam. / Monthly.  Influenza vaccine. / Every year.  Tetanus, diphtheria, and acellular pertussis (Tdap/Td) vaccine.** / 1 dose of Td every 10 years.  Varicella vaccine.** / Consult your health care provider.  Zoster vaccine.** / 1 dose for adults aged 5 years or older.  Pneumococcal 13-valent conjugate (PCV13) vaccine.** / Consult your health care provider.  Pneumococcal polysaccharide (PPSV23) vaccine.** / 1 dose for all adults aged 74 years and older.  Meningococcal vaccine.** / Consult your health care provider.  Hepatitis A vaccine.** / Consult your health care provider.  Hepatitis B vaccine.** / Consult your health care provider.  Haemophilus influenzae type b (Hib) vaccine.** / Consult your health care provider. ** Family history and personal history of risk and conditions may change your health care provider's recommendations. Document Released: 12/06/2001 Document Revised: 07/31/2013  [facility] Patient Information 2014 [place], [place].   EXERCISE AND DIET:  We recommended that you start or continue a regular exercise program for good health. Regular exercise means any activity that makes your heart beat faster and makes you sweat.  We recommend exercising at least 30 minutes per day at least 3 days a week, preferably 5.  We also recommend a diet low in fat and sugar / carbohydrates.  Inactivity, poor dietary choices and obesity can cause diabetes, heart attack, stroke, and kidney damage, among others.     ALCOHOL AND SMOKING:  Women should limit their alcohol intake to no  more than 7 drinks/beers/glasses of wine (combined, not each!) per week. Moderation of alcohol intake to this level decreases your risk of breast cancer and liver damage.  ( And of course, no recreational drugs are part of a healthy lifestyle.)  Also, you should not be smoking at all or even being exposed to second hand smoke. Most people know smoking can  cause cancer, and various heart and lung diseases, but did you know it also contributes to weakening of your bones?  Aging of your skin?  Yellowing of your teeth and nails?   CALCIUM AND VITAMIN D:  Adequate intake of calcium and Vitamin D are recommended.  The recommendations for exact amounts of these supplements seem to change often, but generally speaking 600 mg of calcium (either carbonate or citrate) and 800 units of Vitamin D per day seems prudent. Certain women may benefit from higher intake of Vitamin D.  If you are among these women, your doctor will have told you during your visit.     PAP SMEARS:  Pap smears, to check for cervical cancer or precancers,  have traditionally been done yearly, although recent scientific advances have shown that most women can have pap smears less often.  However, every woman still should have a physical exam from her gynecologist or primary care physician every year. It will include a breast check, inspection of the vulva and vagina to check for abnormal growths or skin changes, a visual exam of the cervix, and then an exam to evaluate the size and shape of the uterus and ovaries.  And after 31 years of age, a rectal exam is indicated to check for rectal cancers. We will also provide age appropriate advice regarding health maintenance, like when you should have certain vaccines, screening for sexually transmitted diseases, bone density testing, colonoscopy, mammograms, etc.    MAMMOGRAMS:  All women over 70 years old should have a yearly mammogram. Many facilities now offer a "3D" mammogram, which may cost  around $50 extra out of pocket. If possible,  we recommend you accept the option to have the 3D mammogram performed.  It both reduces the number of women who will be called back for extra views which then turn out to be normal, and it is better than the routine mammogram at detecting truly abnormal areas.     COLONOSCOPY:  Colonoscopy to screen for colon cancer is recommended for all women at age 3.  We know, you hate the idea of the prep.  We agree, BUT, having colon cancer and not knowing it is worse!!  Colon cancer so often starts as a polyp that can be seen and removed at colonscopy, which can quite literally save your life!  And if your first colonoscopy is normal and you have no family history of colon cancer, most women don't have to have it again for 10 years.  Once every ten years, you can do something that may end up saving your life, right?  We will be happy to help you get it scheduled when you are ready.  Be sure to check your insurance coverage so you understand how much it will cost.  It may be covered as a preventative service at no cost, but you should check your particular policy.    Your blood pressure and weight are wonderful. Most of your recent labs look very good. Recommended re-checking A1c is a little low (average of your blood sugar over 3 month period) and metabolic panel in 6 months (slight elevation in Alkaline Phosphatase). Make sure that you are eating every few hours, especially since you are breastfeeding.  Remain well hydrated and follow heart healthy diet. Keep annual appt with OB/GYN Jan 20201. If your husband needs referral to Urologist, re: vasectomy- please call clinic so that we can place referral. Continue to social distance and wear a mask. Recommend annual physical  with fasting labs. GREAT TO SEE YOU!

## 2019-11-29 ENCOUNTER — Telehealth: Payer: Self-pay

## 2019-11-29 NOTE — Telephone Encounter (Signed)
Pt's spouse called stating that pt has had COVID like symptoms x 3 days with fever, cough, nausea.  Spouse states that pt has been alternating APAP and ibuprofen which temporarily brings down the fevers.  Parents both tested positive for COVID and she was exposed to them recently.  Advised spouse that pt needs to be tested for COVID, but he states that the pt feels too bad to go for testing.  He is asking about monoclonal therapy since pt's father received this treatment and was told that he would feel better within 24 hours.  Advised spouse that pt must be tested for COVID before monoclonal therapy would even be considered and that it would be best if pt was tested at a [facility] facility so that results were in pt's chart.  Also advised spouse that he should contact their pediatrician since pt is breastfeeding to inquire about the safety of breastfeeding given pt's symptoms and the fact that she is taking Tylenol and Ibuprofen.  Spouse expressed understanding and is agreeable.  Charyl Bigger, CMA

## 2019-12-24 LAB — HM PAP SMEAR: HM Pap smear: NEGATIVE

## 2020-03-17 ENCOUNTER — Other Ambulatory Visit: Payer: Self-pay

## 2020-03-17 ENCOUNTER — Other Ambulatory Visit: Payer: 59

## 2020-03-17 DIAGNOSIS — R748 Abnormal levels of other serum enzymes: Secondary | ICD-10-CM

## 2020-03-17 DIAGNOSIS — E162 Hypoglycemia, unspecified: Secondary | ICD-10-CM

## 2020-03-18 LAB — COMPREHENSIVE METABOLIC PANEL
ALT: 12 IU/L (ref 0–32)
AST: 16 IU/L (ref 0–40)
Albumin/Globulin Ratio: 1.5 (ref 1.2–2.2)
Albumin: 4.6 g/dL (ref 3.8–4.8)
Alkaline Phosphatase: 94 IU/L (ref 48–121)
BUN/Creatinine Ratio: 14 (ref 9–23)
BUN: 12 mg/dL (ref 6–20)
Bilirubin Total: 0.5 mg/dL (ref 0.0–1.2)
CO2: 20 mmol/L (ref 20–29)
Calcium: 9.2 mg/dL (ref 8.7–10.2)
Chloride: 103 mmol/L (ref 96–106)
Creatinine, Ser: 0.86 mg/dL (ref 0.57–1.00)
GFR calc Af Amer: 104 mL/min/{1.73_m2} (ref >59)
GFR calc non Af Amer: 90 mL/min/{1.73_m2} (ref >59)
Globulin, Total: 3 g/dL (ref 1.5–4.5)
Glucose: 72 mg/dL (ref 65–99)
Potassium: 4.3 mmol/L (ref 3.5–5.2)
Sodium: 137 mmol/L (ref 134–144)
Total Protein: 7.6 g/dL (ref 6.0–8.5)

## 2020-03-18 LAB — HEMOGLOBIN A1C
Est. average glucose Bld gHb Est-mCnc: 94 mg/dL
Hgb A1c MFr Bld: 4.9 % (ref 4.8–5.6)

## 2021-09-07 ENCOUNTER — Other Ambulatory Visit: Payer: Self-pay

## 2021-09-07 ENCOUNTER — Encounter: Payer: Self-pay | Admitting: Physician Assistant

## 2021-09-07 ENCOUNTER — Ambulatory Visit (INDEPENDENT_AMBULATORY_CARE_PROVIDER_SITE_OTHER): Payer: 59 | Admitting: Physician Assistant

## 2021-09-07 VITALS — BP 121/84 | HR 74 | Temp 98.1°F | Ht 61.0 in | Wt 114.8 lb

## 2021-09-07 DIAGNOSIS — N888 Other specified noninflammatory disorders of cervix uteri: Secondary | ICD-10-CM

## 2021-09-07 DIAGNOSIS — Z23 Encounter for immunization: Secondary | ICD-10-CM

## 2021-09-07 DIAGNOSIS — Z0000 Encounter for general adult medical examination without abnormal findings: Secondary | ICD-10-CM | POA: Diagnosis not present

## 2021-09-07 NOTE — Patient Instructions (Addendum)

## 2021-09-07 NOTE — Progress Notes (Signed)
Subjective:     Meagan Torres is a 35 y.o. female and is here for a comprehensive physical exam. The patient reports no problems.  Social History   Socioeconomic History   Marital status: Married    Spouse name: Larkin Ina   Number of children: 1   Years of education: Not on file   Highest education level: Not on file  Occupational History   Not on file  Tobacco Use   Smoking status: Never   Smokeless tobacco: Never  Vaping Use   Vaping Use: Never used  Substance and Sexual Activity   Alcohol use: Yes    Comment: 1-2 per month   Drug use: No   Sexual activity: Yes    Birth control/protection: None  Other Topics Concern   Not on file  Social History Narrative   Not on file   Social Determinants of Health   Financial Resource Strain: Not on file  Food Insecurity: Not on file  Transportation Needs: Not on file  Physical Activity: Not on file  Stress: Not on file  Social Connections: Not on file  Intimate Partner Violence: Not on file   Health Maintenance  Topic Date Due   Hepatitis C Screening  Never done   PAP SMEAR-Modifier  Never done   TETANUS/TDAP  04/21/2029   INFLUENZA VACCINE  Completed   HIV Screening  Completed   Pneumococcal Vaccine 3-25 Years old  Aged Out   HPV VACCINES  Aged Out    The following portions of the patient's history were reviewed and updated as appropriate: allergies, current medications, past family history, past medical history, past social history, past surgical history, and problem list.  Review of Systems Pertinent items noted in HPI and remainder of comprehensive ROS otherwise negative.   Objective:    BP 121/84   Pulse 74   Temp 98.1 F (36.7 C)   Ht 5\' 1"  (1.549 m)   Wt 114 lb 12.8 oz (52.1 kg)   SpO2 99%   Breastfeeding No   BMI 21.69 kg/m  General appearance: alert, cooperative, and no distress Head: Normocephalic, without obvious abnormality, atraumatic Eyes: conjunctivae/corneas clear. PERRL, EOM's intact. Fundi  benign. Ears: normal TM's and external ear canals both ears Nose: Nares normal. Septum midline. Mucosa normal. No drainage or sinus tenderness. Throat: normal findings: lips normal without lesions, buccal mucosa normal, gums healthy, teeth intact, non-carious, and tongue midline and normal and abnormal findings: mild oropharyngeal erythema w/o exudates Neck: no JVD, supple, symmetrical, trachea midline, thyroid: normal to inspection and palpation, and small, firm non-tender mass below right earlobe . Back: symmetric, no curvature. ROM normal. No CVA tenderness. Lungs: clear to auscultation bilaterally Breasts:  deferred to OB/GYN Heart: regular rate and rhythm, S1, S2 normal, no murmur, click, rub or gallop Abdomen: soft, non-tender; bowel sounds normal; no masses,  no organomegaly Pelvic: deferred to OB/GYN Extremities: extremities normal, atraumatic, no cyanosis or edema Pulses: 2+ and symmetric Skin: Skin color, texture, turgor normal. No rashes or lesions Lymph nodes: Supraclavicular adenopathy: normal and anterior adenopathy normal Neurologic: Grossly normal    Assessment:    Healthy female exam.     Plan:  -Recommend to schedule lab visit for fasting blood work.  -Will request records (pap) from [facility]. -Patient agreeable to influenza vaccine. Declined hep C screening. -Recommend to continue with good hydration, follow a heart healthy diet such as Mediterranean and increase exercise with ultimate goal of 150 minutes/wk of moderate physical activity. -Discussed with patient non-tender  firm mass possibly cyst or lymph node (pt is getting over a URI).Will collect CBC w/d with lab visit. If mass fails to improve or worsen recommend further evaluation with soft tissue ultrasound. Patient verbalized understanding and will notify the office.  -Follow up in 1 year for CPE and FBW or sooner if needed   See After Visit Summary for Counseling Recommendations

## 2021-09-10 ENCOUNTER — Other Ambulatory Visit: Payer: Self-pay

## 2021-09-10 DIAGNOSIS — Z1321 Encounter for screening for nutritional disorder: Secondary | ICD-10-CM

## 2021-09-10 DIAGNOSIS — Z0000 Encounter for general adult medical examination without abnormal findings: Secondary | ICD-10-CM

## 2021-09-10 DIAGNOSIS — Z130 Encounter for screening for diseases of the blood and blood-forming organs and certain disorders involving the immune mechanism: Secondary | ICD-10-CM

## 2021-09-13 ENCOUNTER — Encounter: Payer: Self-pay | Admitting: Physician Assistant

## 2021-09-13 ENCOUNTER — Other Ambulatory Visit: Payer: Self-pay

## 2021-09-13 ENCOUNTER — Other Ambulatory Visit: Payer: 59

## 2021-09-13 DIAGNOSIS — Z0000 Encounter for general adult medical examination without abnormal findings: Secondary | ICD-10-CM

## 2021-09-13 DIAGNOSIS — Z130 Encounter for screening for diseases of the blood and blood-forming organs and certain disorders involving the immune mechanism: Secondary | ICD-10-CM

## 2021-09-13 DIAGNOSIS — Z1321 Encounter for screening for nutritional disorder: Secondary | ICD-10-CM

## 2021-09-14 LAB — COMPREHENSIVE METABOLIC PANEL
ALT: 8 IU/L (ref 0–32)
AST: 16 IU/L (ref 0–40)
Albumin/Globulin Ratio: 1.7 (ref 1.2–2.2)
Albumin: 4.5 g/dL (ref 3.8–4.8)
Alkaline Phosphatase: 65 IU/L (ref 44–121)
BUN/Creatinine Ratio: 14 (ref 9–23)
BUN: 12 mg/dL (ref 6–20)
Bilirubin Total: 0.8 mg/dL (ref 0.0–1.2)
CO2: 22 mmol/L (ref 20–29)
Calcium: 9.3 mg/dL (ref 8.7–10.2)
Chloride: 104 mmol/L (ref 96–106)
Creatinine, Ser: 0.83 mg/dL (ref 0.57–1.00)
Globulin, Total: 2.6 g/dL (ref 1.5–4.5)
Glucose: 85 mg/dL (ref 70–99)
Potassium: 4.3 mmol/L (ref 3.5–5.2)
Sodium: 138 mmol/L (ref 134–144)
Total Protein: 7.1 g/dL (ref 6.0–8.5)
eGFR: 96 mL/min/{1.73_m2} (ref >59)

## 2021-09-14 LAB — CBC WITH DIFFERENTIAL/PLATELET
Basophils Absolute: 0.1 10*3/uL (ref 0.0–0.2)
Basos: 1 %
EOS (ABSOLUTE): 0.4 10*3/uL (ref 0.0–0.4)
Eos: 6 %
Hematocrit: 40.1 % (ref 34.0–46.6)
Hemoglobin: 13.4 g/dL (ref 11.1–15.9)
Immature Grans (Abs): 0 10*3/uL (ref 0.0–0.1)
Immature Granulocytes: 0 %
Lymphocytes Absolute: 1.8 10*3/uL (ref 0.7–3.1)
Lymphs: 31 %
MCH: 30.2 pg (ref 26.6–33.0)
MCHC: 33.4 g/dL (ref 31.5–35.7)
MCV: 91 fL (ref 79–97)
Monocytes Absolute: 0.4 10*3/uL (ref 0.1–0.9)
Monocytes: 8 %
Neutrophils Absolute: 3.1 10*3/uL (ref 1.4–7.0)
Neutrophils: 54 %
Platelets: 324 10*3/uL (ref 150–450)
RBC: 4.43 x10E6/uL (ref 3.77–5.28)
RDW: 11.8 % (ref 11.7–15.4)
WBC: 5.7 10*3/uL (ref 3.4–10.8)

## 2021-09-14 LAB — LIPID PANEL
Chol/HDL Ratio: 2.5 ratio (ref 0.0–4.4)
Cholesterol, Total: 131 mg/dL (ref 100–199)
HDL: 53 mg/dL (ref >39)
LDL Chol Calc (NIH): 67 mg/dL (ref 0–99)
Triglycerides: 46 mg/dL (ref 0–149)
VLDL Cholesterol Cal: 11 mg/dL (ref 5–40)

## 2021-09-14 LAB — HEMOGLOBIN A1C
Est. average glucose Bld gHb Est-mCnc: 94 mg/dL
Hgb A1c MFr Bld: 4.9 % (ref 4.8–5.6)

## 2021-09-14 LAB — TSH: TSH: 1.25 u[IU]/mL (ref 0.450–4.500)

## 2022-06-21 ENCOUNTER — Telehealth (INDEPENDENT_AMBULATORY_CARE_PROVIDER_SITE_OTHER): Payer: 59 | Admitting: Physician Assistant

## 2022-06-21 ENCOUNTER — Encounter: Payer: Self-pay | Admitting: Physician Assistant

## 2022-06-21 VITALS — Ht 61.0 in | Wt 114.0 lb

## 2022-06-21 DIAGNOSIS — J988 Other specified respiratory disorders: Secondary | ICD-10-CM | POA: Diagnosis not present

## 2022-06-21 DIAGNOSIS — B9689 Other specified bacterial agents as the cause of diseases classified elsewhere: Secondary | ICD-10-CM | POA: Diagnosis not present

## 2022-06-21 DIAGNOSIS — N920 Excessive and frequent menstruation with regular cycle: Secondary | ICD-10-CM | POA: Insufficient documentation

## 2022-06-21 DIAGNOSIS — G43829 Menstrual migraine, not intractable, without status migrainosus: Secondary | ICD-10-CM | POA: Insufficient documentation

## 2022-06-21 MED ORDER — METHYLPREDNISOLONE 4 MG PO TBPK: ORAL_TABLET | ORAL | 0 refills | Status: DC

## 2022-06-21 MED ORDER — AZITHROMYCIN 250 MG PO TABS: ORAL_TABLET | ORAL | 0 refills | Status: AC

## 2022-06-21 NOTE — Progress Notes (Signed)
Telehealth office visit note for Meagan Reid, PA-C- at Primary Care at [facility]   I connected with current patient today by telephone and verified that I am speaking with the correct person    Location of the patient: Home  Location of the provider: Office - This visit type was conducted due to national recommendations for restrictions regarding the COVID-19 Pandemic (e.g. social distancing) in an effort to limit this patient's exposure and mitigate transmission in our community.    - No physical exam could be performed with this format, beyond that communicated to [place] by the patient/ family members as noted.   - Additionally my office staff/ schedulers were to discuss with the patient that there may be a monetary charge related to this service, depending on their medical insurance.  My understanding is that patient understood and consented to proceed.     _________________________________________________________________________________   History of Present Illness: Patient presents with c/o productive cough with green mucus and chest congestion which has been going on for a month. Within the past week has developed low grade fever, today does not feel as feverish, fatigue and intermittent headache which is mostly a her forehead. Does report mild shortness of breath and wheezing with deep breathing. No chest pain, nasal congestion, sore throat or body aches. Has an earache about 2 days ago which has resolved.         06/21/2022   11:01 AM 09/07/2021    3:58 PM  GAD 7 : Generalized Anxiety Score  Nervous, Anxious, on Edge 0 0  Control/stop worrying 0 0  Worry too much - different things 0 0  Trouble relaxing 0 0  Restless 0 0  Easily annoyed or irritable 0 0  Afraid - awful might happen 0 0  Total GAD 7 Score 0 0  Anxiety Difficulty Not difficult at all Not difficult at all       06/21/2022   11:01 AM 09/07/2021    3:58 PM 09/16/2019    1:22 PM 09/12/2018   11:16  AM 08/08/2018   10:43 AM  Depression screen PHQ 2/9  Decreased Interest 0 0 0 1 0  Down, Depressed, Hopeless 0 0 0 1 1  PHQ - 2 Score 0 0 0 2 1  Altered sleeping 0 0 0 1 0  Tired, decreased energy 0 0 0 1 1  Change in appetite 0 0 0 0 0  Feeling bad or failure about yourself  0 0 0 1 1  Trouble concentrating 0 0 0 0 0  Moving slowly or fidgety/restless 0 0 0 0 0  Suicidal thoughts 0 0 0 0 0  PHQ-9 Score 0 0 0 5 3  Difficult doing work/chores Not difficult at all Not difficult at all  Somewhat difficult Somewhat difficult      Impression and Recommendations:     1. Bacterial respiratory infection   -Discussed with patient s/sx suggestive of bacterial respiratory infection especially with some symptoms ongoing for >10 days so will start oral antibiotic therapy with Azithromycin as prescribed and oral steroid taper to help improve breathing and congestion. Also recommend to take a decongestant. If symptoms become severe recommend seeking ED evaluation. Follow-up prn.    - As part of my medical decision making, I reviewed the following data within the [place] History obtained from pt /family, CMA notes reviewed and incorporated if applicable, Labs reviewed, Radiograph/ tests reviewed if applicable and OV notes from  prior OV's with me, as well as any other specialists she/he has seen since seeing me last, were all reviewed and used in my medical decision making process today.    - Additionally, when appropriate, discussion had with patient regarding our treatment plan, and their biases/concerns about that plan were used in my medical decision making today.    - The patient agreed with the plan and demonstrated an understanding of the instructions.   No barriers to understanding were identified.     - The patient was advised to call back or seek an in-person evaluation if the symptoms worsen or if the condition fails to improve as anticipated.   Return if symptoms  worsen or fail to improve.    No orders of the defined types were placed in this encounter.   Meds ordered this encounter  Medications   azithromycin (ZITHROMAX) 250 MG tablet    Sig: Take 2 tablets on day 1, then 1 tablet daily on days 2 through 5    Dispense:  6 tablet    Refill:  0    Order Specific Question:   Supervising Provider    Answer:   Beatrice Lecher D [[id number]]   methylPREDNISolone (MEDROL DOSEPAK) 4 MG TBPK tablet    Sig: Take as directed on package.    Dispense:  21 tablet    Refill:  0    Order Specific Question:   Supervising Provider    Answer:   Beatrice Lecher D [[id number]]    There are no discontinued medications.     Time spent on telephone encounter was 7 minutes.      The [place] was signed into law in 2016 which includes the topic of electronic health records.  This provides immediate access to information in MyChart.  This includes consultation notes, operative notes, office notes, lab results and pathology reports.  If you have any questions about what you read please let us know at your next visit or call us at the office.  We are right here with you.   __________________________________________________________________________________     Patient Care Team    Relationship Specialty Notifications Start End  Meagan Torres, [place] PCP - General Physician Assistant  03/17/20   Dermatology, [facility]    08/08/18   Obgyn, [facility]    08/08/18      -Vitals obtained; medications/ allergies reconciled;  personal medical, social, Sx etc.histories were updated by CMA, reviewed by me and are reflected in chart   Patient Active Problem List   Diagnosis Date Noted   Menorrhagia 06/21/2022   Menstrual migraine 06/21/2022   Hypoglycemia 09/16/2019   Elevated serum alkaline phosphatase level 09/16/2019   Postpartum care following VAVD (8/23) 06/17/2019   Term pregnancy 06/16/2019   Vacuum-assisted vaginal delivery 06/16/2019    Healthcare maintenance 08/08/2018   Labor abnormality 02/15/2018   Fetal demise, greater than 22 weeks, antepartum, fetus 1 02/15/2018   Cystic hygroma of fetus in singleton pregnancy 12/28/2017   Fetal hydrops 12/28/2017   [redacted] weeks gestation of pregnancy    Vacuum extraction, delivered, current hospitalization 12/21/2014   Third degree perineal laceration, delivered, current hospitalization 12/21/2014     Current Meds  Medication Sig   albuterol (PROAIR HFA) 108 (90 Base) MCG/ACT inhaler Inhale 2 puffs into the lungs every 6 (six) hours as needed.   azithromycin (ZITHROMAX) 250 MG tablet Take 2 tablets on day 1, then 1 tablet daily on days 2 through 5   methylPREDNISolone (MEDROL DOSEPAK)  4 MG TBPK tablet Take as directed on package.     Allergies:  No Known Allergies   ROS:  See above HPI for pertinent positives and negatives   Objective:   Height '5\' 1"'$  (1.549 m), weight 114 lb (51.7 kg).  (if some vitals are omitted, this means that patient was UNABLE to obtain them. ) General: A & O * 3; sounds in no acute distress; sounds congested Respiratory: speaking in full sentences, no conversational dyspnea Psych: insight appears good, mood- appears full

## 2022-06-21 NOTE — Patient Instructions (Signed)

## 2022-09-08 ENCOUNTER — Ambulatory Visit (INDEPENDENT_AMBULATORY_CARE_PROVIDER_SITE_OTHER): Payer: 59 | Admitting: Physician Assistant

## 2022-09-08 ENCOUNTER — Encounter: Payer: Self-pay | Admitting: Physician Assistant

## 2022-09-08 ENCOUNTER — Other Ambulatory Visit: Payer: Self-pay

## 2022-09-08 VITALS — BP 119/88 | HR 89 | Resp 18 | Ht 61.0 in | Wt 114.0 lb

## 2022-09-08 DIAGNOSIS — R062 Wheezing: Secondary | ICD-10-CM

## 2022-09-08 DIAGNOSIS — Z0000 Encounter for general adult medical examination without abnormal findings: Secondary | ICD-10-CM

## 2022-09-08 DIAGNOSIS — Z23 Encounter for immunization: Secondary | ICD-10-CM

## 2022-09-08 MED ORDER — ALBUTEROL SULFATE HFA 108 (90 BASE) MCG/ACT IN AERS: 1.0000 | INHALATION_SPRAY | Freq: Four times a day (QID) | RESPIRATORY_TRACT | 3 refills | Status: AC | PRN

## 2022-09-08 NOTE — Progress Notes (Signed)
Complete physical exam   Patient: Meagan Torres   DOB: 1986/03/13   36 y.o. Female  MRN: [id number] Visit Date: 09/08/2022   Chief Complaint  Patient presents with   Annual Exam    Non Fasting   Subjective    Meagan Torres is a 36 y.o. female who presents today for a complete physical exam.  She reports consuming a low fat diet. The patient does not participate in regular exercise at present. She generally feels fairly well. She does not have additional problems to discuss today.     Past Medical History:  Diagnosis Date   Asthma    Past Surgical History:  Procedure Laterality Date   BASAL CELL CARCINOMA EXCISION     NO PAST SURGERIES     WISDOM TOOTH EXTRACTION     Social History   Socioeconomic History   Marital status: Married    Spouse name: Larkin Ina   Number of children: 1   Years of education: Not on file   Highest education level: Not on file  Occupational History   Not on file  Tobacco Use   Smoking status: Never   Smokeless tobacco: Never  Vaping Use   Vaping Use: Never used  Substance and Sexual Activity   Alcohol use: Yes    Comment: 1-2 per month   Drug use: No   Sexual activity: Yes    Birth control/protection: None  Other Topics Concern   Not on file  Social History Narrative   Not on file   Social Determinants of Health   Financial Resource Strain: Not on file  Food Insecurity: Not on file  Transportation Needs: Not on file  Physical Activity: Not on file  Stress: Not on file  Social Connections: Not on file  Intimate Partner Violence: Not on file     Medications: Outpatient Medications Prior to Visit  Medication Sig   [DISCONTINUED] albuterol (PROAIR HFA) 108 (90 Base) MCG/ACT inhaler Inhale 2 puffs into the lungs every 6 (six) hours as needed.   [DISCONTINUED] methylPREDNISolone (MEDROL DOSEPAK) 4 MG TBPK tablet Take as directed on package. (Patient not taking: Reported on 09/08/2022)   No facility-administered medications prior  to visit.    Review of Systems Review of Systems:  A fourteen system review of systems was performed and found to be positive as per HPI.  Last CBC Lab Results  Component Value Date   WBC 5.7 09/13/2021   HGB 13.4 09/13/2021   HCT 40.1 09/13/2021   MCV 91 09/13/2021   MCH 30.2 09/13/2021   RDW 11.8 09/13/2021   PLT 324 33/82/5053   Last metabolic panel Lab Results  Component Value Date   GLUCOSE 85 09/13/2021   NA 138 09/13/2021   K 4.3 09/13/2021   CL 104 09/13/2021   CO2 22 09/13/2021   BUN 12 09/13/2021   CREATININE 0.83 09/13/2021   EGFR 96 09/13/2021   CALCIUM 9.3 09/13/2021   PROT 7.1 09/13/2021   ALBUMIN 4.5 09/13/2021   LABGLOB 2.6 09/13/2021   AGRATIO 1.7 09/13/2021   BILITOT 0.8 09/13/2021   ALKPHOS 65 09/13/2021   AST 16 09/13/2021   ALT 8 09/13/2021   ANIONGAP 6 12/20/2014   Last lipids Lab Results  Component Value Date   CHOL 131 09/13/2021   HDL 53 09/13/2021   LDLCALC 67 09/13/2021   TRIG 46 09/13/2021   CHOLHDL 2.5 09/13/2021   Last hemoglobin A1c Lab Results  Component Value Date   HGBA1C 4.9 09/13/2021  Last thyroid functions Lab Results  Component Value Date   TSH 1.250 09/13/2021   Last vitamin D No results found for: "25OHVITD2", "25OHVITD3", "VD25OH"    Objective    BP 119/88 (BP Location: Left Arm, Patient Position: Sitting, Cuff Size: Normal)   Pulse 89   Resp 18   Ht _0  (1.549 m)   Wt 114 lb (51.7 kg)   LMP 08/17/2022 (Exact Date)   SpO2 99%   BMI 21.54 kg/m  BP Readings from Last 3 Encounters:  09/08/22 119/88  09/07/21 121/84  09/16/19 120/80   Wt Readings from Last 3 Encounters:  09/08/22 114 lb (51.7 kg)  06/21/22 114 lb (51.7 kg)  09/07/21 114 lb 12.8 oz (52.1 kg)    Physical Exam   General Appearance:    Well developed, well nourished female. Alert, cooperative, in no acute distress, appears stated age   Head:    Normocephalic, without obvious abnormality, atraumatic  Eyes:    PERRL,  conjunctiva/corneas clear, EOM's intact, fundi    benign, both eyes  Ears:    Normal TM's and external ear canals, both ears  Nose:   Nares normal, septum midline, mucosa normal, no drainage    or sinus tenderness  Throat:   Lips, mucosa, and tongue normal; teeth and gums normal  Neck:   Supple, symmetrical, trachea midline, no adenopathy;    thyroid:  no enlargement/tenderness/nodules; no JVD  Back:     Symmetric, no curvature, ROM normal, no CVA tenderness  Lungs:     Clear to auscultation bilaterally, respirations unlabored  Chest Wall:    No tenderness or deformity   Heart:    Normal heart rate. Normal rhythm. No murmurs, rubs, or gallops.   Breast Exam:    deferred  Abdomen:     Soft, non-tender, bowel sounds active all four quadrants,    no masses, no organomegaly  Pelvic:    deferred  Extremities:   All extremities are intact. No cyanosis or edema  Pulses:   2+ and symmetric all extremities  Skin:   Skin color, texture, turgor normal, no rashes or suspicious lesions  Lymph nodes:   Cervical, supraclavicular nodes normal  Neurologic:   CNII-XII grossly intact.     Last depression screening scores    09/08/2022    4:04 PM 06/21/2022   11:01 AM 09/07/2021    3:58 PM  PHQ 2/9 Scores  PHQ - 2 Score 0 0 0  PHQ- 9 Score 0 0 0   Last fall risk screening    09/08/2022    4:04 PM  Fall Risk   Falls in the past year? 1  Number falls in past yr: 0  Injury with Fall? 0     No results found for any visits on 09/08/22.  Assessment & Plan    Routine Health Maintenance and Physical Exam  Exercise Activities and Dietary recommendations -A heart healthy diet low in fat and carbohydrates. Recommend moderate exercise 150 mins/wk.  Immunization History  Administered Date(s) Administered   Influenza,inj,Quad PF,6+ Mos 09/12/2018, 09/10/2019, 09/07/2021, 09/08/2022   PFIZER(Purple Top)SARS-COV-2 Vaccination 01/15/2020, 02/05/2020, 09/11/2020   Tdap 04/22/2019    Health  Maintenance  Topic Date Due   Hepatitis C Screening  Never done   COVID-19 Vaccine (4 - Pfizer series) 11/06/2020   PAP SMEAR-Modifier  12/24/2022   TETANUS/TDAP  04/21/2029   INFLUENZA VACCINE  Completed   HIV Screening  Completed   HPV VACCINES  Aged Out  Discussed health benefits of physical activity, and encouraged her to engage in regular exercise appropriate for her age and condition.  Problem List Items Addressed This Visit       Other   Healthcare maintenance - Primary   Other Visit Diagnoses     Need for influenza vaccination       Relevant Orders   Flu Vaccine QUAD 6+ mos PF IM (Fluarix Quad PF) (Completed)   Wheezing       Relevant Medications   albuterol (PROAIR HFA) 108 (90 Base) MCG/ACT inhaler      Recommend to schedule lab visit for routine fasting blood-work. Pt agreeable to flu vaccine. UTD pap (followed by OB/GYN) and tdap. Discussed stretches and exercises. Provided refill of albuterol to use as needed for wheezing. Follow-up in 1 yr for CPE and FBW or sooner if needed.  Return in about 1 year (around 09/09/2023) for CPE and FBW; lab visit in 1-3 weeks for FBW.       Lorrene Reid, PA-C  [facility] [phone] (phone) [phone] (fax)  [place]

## 2022-09-08 NOTE — Patient Instructions (Signed)

## 2022-09-21 ENCOUNTER — Other Ambulatory Visit: Payer: 59

## 2022-09-21 DIAGNOSIS — Z0000 Encounter for general adult medical examination without abnormal findings: Secondary | ICD-10-CM

## 2022-09-22 LAB — COMPREHENSIVE METABOLIC PANEL
ALT: 8 IU/L (ref 0–32)
AST: 16 IU/L (ref 0–40)
Albumin/Globulin Ratio: 1.7 (ref 1.2–2.2)
Albumin: 4.5 g/dL (ref 3.9–4.9)
Alkaline Phosphatase: 79 IU/L (ref 44–121)
BUN/Creatinine Ratio: 13 (ref 9–23)
BUN: 12 mg/dL (ref 6–20)
Bilirubin Total: 0.4 mg/dL (ref 0.0–1.2)
CO2: 25 mmol/L (ref 20–29)
Calcium: 9.3 mg/dL (ref 8.7–10.2)
Chloride: 103 mmol/L (ref 96–106)
Creatinine, Ser: 0.89 mg/dL (ref 0.57–1.00)
Globulin, Total: 2.6 g/dL (ref 1.5–4.5)
Glucose: 81 mg/dL (ref 70–99)
Potassium: 4.3 mmol/L (ref 3.5–5.2)
Sodium: 139 mmol/L (ref 134–144)
Total Protein: 7.1 g/dL (ref 6.0–8.5)
eGFR: 88 mL/min/{1.73_m2} (ref >59)

## 2022-09-22 LAB — CBC WITH DIFFERENTIAL/PLATELET
Basophils Absolute: 0.1 10*3/uL (ref 0.0–0.2)
Basos: 2 %
EOS (ABSOLUTE): 0.3 10*3/uL (ref 0.0–0.4)
Eos: 5 %
Hematocrit: 40.3 % (ref 34.0–46.6)
Hemoglobin: 13.7 g/dL (ref 11.1–15.9)
Immature Grans (Abs): 0 10*3/uL (ref 0.0–0.1)
Immature Granulocytes: 0 %
Lymphocytes Absolute: 2.2 10*3/uL (ref 0.7–3.1)
Lymphs: 42 %
MCH: 29.6 pg (ref 26.6–33.0)
MCHC: 34 g/dL (ref 31.5–35.7)
MCV: 87 fL (ref 79–97)
Monocytes Absolute: 0.5 10*3/uL (ref 0.1–0.9)
Monocytes: 9 %
Neutrophils Absolute: 2.2 10*3/uL (ref 1.4–7.0)
Neutrophils: 42 %
Platelets: 333 10*3/uL (ref 150–450)
RBC: 4.63 x10E6/uL (ref 3.77–5.28)
RDW: 11.9 % (ref 11.7–15.4)
WBC: 5.3 10*3/uL (ref 3.4–10.8)

## 2022-09-22 LAB — TSH: TSH: 1.42 u[IU]/mL (ref 0.450–4.500)

## 2022-09-22 LAB — LIPID PANEL
Chol/HDL Ratio: 2.4 ratio (ref 0.0–4.4)
Cholesterol, Total: 142 mg/dL (ref 100–199)
HDL: 60 mg/dL (ref >39)
LDL Chol Calc (NIH): 68 mg/dL (ref 0–99)
Triglycerides: 69 mg/dL (ref 0–149)
VLDL Cholesterol Cal: 14 mg/dL (ref 5–40)

## 2022-09-22 LAB — HEMOGLOBIN A1C
Est. average glucose Bld gHb Est-mCnc: 100 mg/dL
Hgb A1c MFr Bld: 5.1 % (ref 4.8–5.6)

## 2022-11-03 ENCOUNTER — Other Ambulatory Visit: Payer: Self-pay | Admitting: Obstetrics and Gynecology

## 2022-11-03 DIAGNOSIS — N6311 Unspecified lump in the right breast, upper outer quadrant: Secondary | ICD-10-CM

## 2022-11-08 LAB — HM PAP SMEAR: HPV, high-risk: NEGATIVE

## 2022-11-14 ENCOUNTER — Ambulatory Visit
Admission: RE | Admit: 2022-11-14 | Discharge: 2022-11-14 | Disposition: A | Discharge status: Home / Self Care | Payer: 59 | Source: Ambulatory Visit | Attending: Obstetrics and Gynecology | Admitting: Obstetrics and Gynecology

## 2022-11-14 ENCOUNTER — Other Ambulatory Visit: Payer: Self-pay | Admitting: Obstetrics and Gynecology

## 2022-11-14 DIAGNOSIS — N6311 Unspecified lump in the right breast, upper outer quadrant: Secondary | ICD-10-CM

## 2022-11-14 DIAGNOSIS — R921 Mammographic calcification found on diagnostic imaging of breast: Secondary | ICD-10-CM

## 2023-03-23 ENCOUNTER — Ambulatory Visit: Payer: 59 | Admitting: Family Medicine

## 2023-03-23 ENCOUNTER — Encounter: Payer: Self-pay | Admitting: Family Medicine

## 2023-03-23 VITALS — BP 129/88 | HR 72 | Resp 18 | Ht 61.0 in | Wt 113.0 lb

## 2023-03-23 DIAGNOSIS — R8290 Unspecified abnormal findings in urine: Secondary | ICD-10-CM | POA: Diagnosis not present

## 2023-03-23 DIAGNOSIS — R300 Dysuria: Secondary | ICD-10-CM | POA: Diagnosis not present

## 2023-03-23 DIAGNOSIS — R3915 Urgency of urination: Secondary | ICD-10-CM

## 2023-03-23 LAB — POCT URINALYSIS DIPSTICK
Bilirubin, UA: NEGATIVE
Blood, UA: NEGATIVE
Glucose, UA: NEGATIVE
Ketones, UA: NEGATIVE
Leukocytes, UA: NEGATIVE
Nitrite, UA: NEGATIVE
Protein, UA: NEGATIVE
Spec Grav, UA: 1.02 (ref 1.010–1.025)
Urobilinogen, UA: 0.2 E.U./dL
pH, UA: 7.5 (ref 5.0–8.0)

## 2023-03-23 MED ORDER — NITROFURANTOIN MONOHYD MACRO 100 MG PO CAPS: 100.0000 mg | ORAL_CAPSULE | Freq: Two times a day (BID) | ORAL | 0 refills | Status: DC

## 2023-03-23 NOTE — Patient Instructions (Addendum)
Please send me a message if your symptoms have not improved after about a week.  At that point, I would think that overactive bladder is probably more likely.  You are also due for your pap smear, so if you have not scheduled that with your OBGYN I recommend doing so!

## 2023-03-23 NOTE — Progress Notes (Signed)
Acute Office Visit  Subjective:     Patient ID: Meagan Torres, female    DOB: 1986-07-20, 37 y.o.   MRN: [id number]  Chief Complaint  Patient presents with   Urinary Urgency    HPI Patient is in today for urinary urgency. Urinary Tract Infection: Patient complains of abnormal smelling urine and urgency She has had symptoms for 8 days. Patient denies back pain, congestion, cough, fever, headache, and vaginal discharge. Patient does not have a history of recurrent UTI.  Patient does not have a history of pyelonephritis.  She has been drinking a lot of water, cranberry juice, taking vitamin C supplements at home.  This has not relieved her symptoms.  ROS Negative unless otherwise noted in HPI    Objective:    BP 129/88 (BP Location: Left Arm, Patient Position: Sitting, Cuff Size: Normal)   Pulse 72   Resp 18   Ht 5\' 1"  (1.549 m)   Wt 113 lb (51.3 kg)   LMP 03/05/2023 (Exact Date)   SpO2 99%   BMI 21.35 kg/m   Physical Exam Constitutional:      General: She is not in acute distress.    Appearance: Normal appearance.  HENT:     Head: Normocephalic and atraumatic.  Cardiovascular:     Rate and Rhythm: Normal rate and regular rhythm.     Pulses: Normal pulses.     Heart sounds: No murmur heard.    No friction rub. No gallop.  Pulmonary:     Effort: Pulmonary effort is normal. No respiratory distress.     Breath sounds: No wheezing, rhonchi or rales.  Abdominal:     General: Abdomen is flat.     Palpations: Abdomen is soft.  Skin:    General: Skin is warm and dry.  Neurological:     Mental Status: She is alert and oriented to person, place, and time.    Results for orders placed or performed in visit on 03/23/23  POCT Urinalysis Dipstick  Result Value Ref Range   Color, UA Yellow    Clarity, UA Clear    Glucose, UA Negative Negative   Bilirubin, UA Negative    Ketones, UA Negative    Spec Grav, UA 1.020 1.010 - 1.025   Blood, UA Negative    pH, UA 7.5 5.0 -  8.0   Protein, UA Negative Negative   Urobilinogen, UA 0.2 0.2 or 1.0 E.U./dL   Nitrite, UA Negative    Leukocytes, UA Negative Negative   Appearance     Odor       Assessment & Plan:  Dysuria -     POCT urinalysis dipstick  Urinary urgency -     Nitrofurantoin Monohyd Macro; Take 1 capsule (100 mg total) by mouth 2 (two) times daily.  Dispense: 6 capsule; Refill: 0  Abnormal urine odor -     Nitrofurantoin Monohyd Macro; Take 1 capsule (100 mg total) by mouth 2 (two) times daily.  Dispense: 6 capsule; Refill: 0  Urinalysis negative for leukocytes, but given patient presentation I am still suspicious of a UTI.  We discussed management options including watchful waiting, short course of antibiotics, or referral to urology.  Patient is agreeable to a short course of nitrofurantoin to cover for UTI.  If symptoms do not improve after that, suspect that overactive bladder is the most likely diagnosis.  If so, will provide referral to pelvic floor PT.  Return if symptoms worsen or fail to improve.  Meagan Torres  [place], [place]

## 2023-05-16 ENCOUNTER — Ambulatory Visit
Admission: RE | Admit: 2023-05-16 | Discharge: 2023-05-16 | Disposition: A | Discharge status: Home / Self Care | Payer: 59 | Source: Ambulatory Visit | Attending: Obstetrics and Gynecology | Admitting: Obstetrics and Gynecology

## 2023-05-16 DIAGNOSIS — R921 Mammographic calcification found on diagnostic imaging of breast: Secondary | ICD-10-CM

## 2023-05-17 ENCOUNTER — Other Ambulatory Visit: Payer: Self-pay | Admitting: Obstetrics and Gynecology

## 2023-05-17 DIAGNOSIS — R921 Mammographic calcification found on diagnostic imaging of breast: Secondary | ICD-10-CM

## 2023-08-28 ENCOUNTER — Other Ambulatory Visit: Payer: Self-pay

## 2023-08-28 DIAGNOSIS — Z0000 Encounter for general adult medical examination without abnormal findings: Secondary | ICD-10-CM

## 2023-09-05 ENCOUNTER — Other Ambulatory Visit: Payer: 59

## 2023-09-05 DIAGNOSIS — Z0000 Encounter for general adult medical examination without abnormal findings: Secondary | ICD-10-CM

## 2023-09-06 LAB — COMPREHENSIVE METABOLIC PANEL
ALT: 6 [IU]/L (ref 0–32)
AST: 15 [IU]/L (ref 0–40)
Albumin: 4.6 g/dL (ref 3.9–4.9)
Alkaline Phosphatase: 70 [IU]/L (ref 44–121)
BUN/Creatinine Ratio: 18 (ref 9–23)
BUN: 15 mg/dL (ref 6–20)
Bilirubin Total: 0.5 mg/dL (ref 0.0–1.2)
CO2: 22 mmol/L (ref 20–29)
Calcium: 9.3 mg/dL (ref 8.7–10.2)
Chloride: 103 mmol/L (ref 96–106)
Creatinine, Ser: 0.85 mg/dL (ref 0.57–1.00)
Globulin, Total: 2.4 g/dL (ref 1.5–4.5)
Glucose: 84 mg/dL (ref 70–99)
Potassium: 4.1 mmol/L (ref 3.5–5.2)
Sodium: 138 mmol/L (ref 134–144)
Total Protein: 7 g/dL (ref 6.0–8.5)
eGFR: 92 mL/min/{1.73_m2} (ref >59)

## 2023-09-06 LAB — CBC WITH DIFFERENTIAL/PLATELET
Basophils Absolute: 0.1 10*3/uL (ref 0.0–0.2)
Basos: 2 %
EOS (ABSOLUTE): 0.3 10*3/uL (ref 0.0–0.4)
Eos: 5 %
Hematocrit: 40.8 % (ref 34.0–46.6)
Hemoglobin: 13.3 g/dL (ref 11.1–15.9)
Immature Grans (Abs): 0 10*3/uL (ref 0.0–0.1)
Immature Granulocytes: 0 %
Lymphocytes Absolute: 1.8 10*3/uL (ref 0.7–3.1)
Lymphs: 33 %
MCH: 30.6 pg (ref 26.6–33.0)
MCHC: 32.6 g/dL (ref 31.5–35.7)
MCV: 94 fL (ref 79–97)
Monocytes Absolute: 0.5 10*3/uL (ref 0.1–0.9)
Monocytes: 9 %
Neutrophils Absolute: 2.8 10*3/uL (ref 1.4–7.0)
Neutrophils: 51 %
Platelets: 288 10*3/uL (ref 150–450)
RBC: 4.35 x10E6/uL (ref 3.77–5.28)
RDW: 11.8 % (ref 11.7–15.4)
WBC: 5.5 10*3/uL (ref 3.4–10.8)

## 2023-09-06 LAB — LIPID PANEL
Chol/HDL Ratio: 2.3 ratio (ref 0.0–4.4)
Cholesterol, Total: 158 mg/dL (ref 100–199)
HDL: 68 mg/dL (ref >39)
LDL Chol Calc (NIH): 76 mg/dL (ref 0–99)
Triglycerides: 71 mg/dL (ref 0–149)
VLDL Cholesterol Cal: 14 mg/dL (ref 5–40)

## 2023-09-06 LAB — HEMOGLOBIN A1C
Est. average glucose Bld gHb Est-mCnc: 100 mg/dL
Hgb A1c MFr Bld: 5.1 % (ref 4.8–5.6)

## 2023-09-06 LAB — TSH: TSH: 1.87 u[IU]/mL (ref 0.450–4.500)

## 2023-09-11 ENCOUNTER — Ambulatory Visit (INDEPENDENT_AMBULATORY_CARE_PROVIDER_SITE_OTHER): Payer: 59 | Admitting: Family Medicine

## 2023-09-11 ENCOUNTER — Encounter: Payer: Self-pay | Admitting: Family Medicine

## 2023-09-11 VITALS — BP 109/72 | HR 72 | Resp 18 | Ht 61.0 in | Wt 116.0 lb

## 2023-09-11 DIAGNOSIS — Z131 Encounter for screening for diabetes mellitus: Secondary | ICD-10-CM

## 2023-09-11 DIAGNOSIS — Z23 Encounter for immunization: Secondary | ICD-10-CM | POA: Diagnosis not present

## 2023-09-11 DIAGNOSIS — Z136 Encounter for screening for cardiovascular disorders: Secondary | ICD-10-CM

## 2023-09-11 DIAGNOSIS — Z1321 Encounter for screening for nutritional disorder: Secondary | ICD-10-CM | POA: Diagnosis not present

## 2023-09-11 DIAGNOSIS — Z1159 Encounter for screening for other viral diseases: Secondary | ICD-10-CM

## 2023-09-11 DIAGNOSIS — Z0000 Encounter for general adult medical examination without abnormal findings: Secondary | ICD-10-CM | POA: Diagnosis not present

## 2023-09-11 NOTE — Progress Notes (Signed)
Complete physical exam  Patient: Meagan Torres   DOB: [birth year]   37 y.o. Female  MRN: [id number]  Subjective:    Chief Complaint  Patient presents with   Annual Exam    Shameka Ortt is a 37 y.o. female who presents today for a complete physical exam. She reports consuming a general diet. The patient does not participate in regular exercise at present. She generally feels well. She reports sleeping well. She does not have additional problems to discuss today.    Most recent fall risk assessment:    09/11/2023    9:03 AM  Fall Risk   Falls in the past year? 0  Number falls in past yr: 0  Injury with Fall? 0  Risk for fall due to : No Fall Risks  Follow up Falls evaluation completed     Most recent depression and anxiety screenings:    09/11/2023    9:03 AM 09/08/2022    4:04 PM  PHQ 2/9 Scores  PHQ - 2 Score 0 0  PHQ- 9 Score 1 0      09/11/2023    9:04 AM 09/08/2022    4:04 PM 06/21/2022   11:01 AM 09/07/2021    3:58 PM  GAD 7 : Generalized Anxiety Score  Nervous, Anxious, on Edge 1 0 0 0  Control/stop worrying 0 0 0 0  Worry too much - different things 0 0 0 0  Trouble relaxing 1 0 0 0  Restless 0 0 0 0  Easily annoyed or irritable 0 0 0 0  Afraid - awful might happen 0 0 0 0  Total GAD 7 Score 2 0 0 0  Anxiety Difficulty Not difficult at all Not difficult at all Not difficult at all Not difficult at all    There are no problems to display for this patient.   Past Surgical History:  Procedure Laterality Date   BASAL CELL CARCINOMA EXCISION     NO PAST SURGERIES     WISDOM TOOTH EXTRACTION     Social History   Tobacco Use   Smoking status: Never    Passive exposure: Never   Smokeless tobacco: Never  Vaping Use   Vaping status: Never Used  Substance Use Topics   Alcohol use: Yes    Comment: 1-2 per month   Drug use: No   Family History  Problem Relation Age of Onset   Hypertension Mother    Cancer Father        melanoma   Breast cancer  Maternal Grandmother    Cancer Maternal Grandmother        breast   Hypertension Maternal Grandmother    Cancer Maternal Grandfather        lung   Stroke Paternal Grandfather    Breast cancer Maternal Great-grandmother    No Known Allergies   Patient Care Team: Melida Quitter, PA as PCP - General (Family Medicine) Dermatology, [facility], [place]   Outpatient Medications Prior to Visit  Medication Sig   albuterol (PROAIR HFA) 108 (90 Base) MCG/ACT inhaler Inhale 1-2 puffs into the lungs every 6 (six) hours as needed.   drospirenone-ethinyl estradiol (YAZ) 3-0.02 MG tablet Take 1 tablet by mouth daily.   [DISCONTINUED] nitrofurantoin, macrocrystal-monohydrate, (MACROBID) 100 MG capsule Take 1 capsule (100 mg total) by mouth 2 (two) times daily.   No facility-administered medications prior to visit.    Review of Systems  Constitutional:  Negative for chills, fever and malaise/fatigue.  HENT:  Negative for congestion and hearing loss.   Eyes:  Negative for blurred vision and double vision.  Respiratory:  Negative for cough and shortness of breath.   Cardiovascular:  Negative for chest pain, palpitations and leg swelling.  Gastrointestinal:  Negative for abdominal pain, constipation, diarrhea and heartburn.  Genitourinary:  Negative for frequency and urgency.  Musculoskeletal:  Negative for myalgias and neck pain.  Neurological:  Negative for headaches.  Endo/Heme/Allergies:  Negative for polydipsia.  Psychiatric/Behavioral:  Negative for depression. The patient is not nervous/anxious and does not have insomnia.       Objective:    BP 109/72 (BP Location: Left Arm, Patient Position: Sitting, Cuff Size: Normal)   Pulse 72   Resp 18   Ht 5\' 1"  (1.549 m)   Wt 116 lb (52.6 kg)   LMP 08/22/2023 (Approximate)   SpO2 100%   BMI 21.92 kg/m    Physical Exam Constitutional:      General: She is not in acute distress.    Appearance: Normal appearance.  HENT:      Head: Normocephalic and atraumatic.     Right Ear: Tympanic membrane, ear canal and external ear normal. There is no impacted cerumen.     Left Ear: Tympanic membrane, ear canal and external ear normal. There is no impacted cerumen.     Nose: Nose normal. No rhinorrhea.     Mouth/Throat:     Mouth: Mucous membranes are moist.     Pharynx: No oropharyngeal exudate or posterior oropharyngeal erythema.  Eyes:     Extraocular Movements: Extraocular movements intact.     Conjunctiva/sclera: Conjunctivae normal.     Pupils: Pupils are equal, round, and reactive to light.     Comments: Does not wear glasses or contacts  Neck:     Thyroid: No thyroid mass, thyromegaly or thyroid tenderness.  Cardiovascular:     Rate and Rhythm: Normal rate and regular rhythm.     Heart sounds: Normal heart sounds. No murmur heard.    No friction rub. No gallop.  Pulmonary:     Effort: Pulmonary effort is normal. No respiratory distress.     Breath sounds: Normal breath sounds. No wheezing, rhonchi or rales.  Abdominal:     General: Abdomen is flat. Bowel sounds are normal. There is no distension.     Palpations: There is no mass.     Tenderness: There is no abdominal tenderness. There is no guarding.  Musculoskeletal:        General: Normal range of motion.     Cervical back: Normal range of motion and neck supple.  Lymphadenopathy:     Cervical: No cervical adenopathy.  Skin:    General: Skin is warm and dry.  Neurological:     Mental Status: She is alert and oriented to person, place, and time.     Cranial Nerves: No cranial nerve deficit.     Motor: No weakness.     Deep Tendon Reflexes: Reflexes normal.  Psychiatric:        Mood and Affect: Mood normal.       Assessment & Plan:    Routine Health Maintenance and Physical Exam  Immunization History  Administered Date(s) Administered   Influenza,inj,Quad PF,6+ Mos 09/12/2018, 09/10/2019, 09/07/2021, 09/08/2022   PFIZER(Purple Top)SARS-COV-2  Vaccination 01/15/2020, 02/05/2020, 09/11/2020   Tdap 04/22/2019    Health Maintenance  Topic Date Due   Hepatitis C Screening  Never done   Cervical Cancer Screening (HPV/Pap Cotest)  12/24/2022  INFLUENZA VACCINE  05/25/2023   COVID-19 Vaccine (4 - 2023-24 season) 09/27/2023 (Originally 06/25/2023)   DTaP/Tdap/Td (2 - Td or Tdap) 04/21/2029   HIV Screening  Completed   HPV VACCINES  Aged Out    Reviewed most recent labs including CBC, CMP, lipid panel, A1C, TSH, and vitamin D. All within normal limits. Requesting copies of most recent cervical cancer screening from [facility]. Agreeable to influenza vaccine today.  Discussed health benefits of physical activity, and encouraged her to engage in regular exercise appropriate for her age and condition.  Wellness examination  Need for influenza vaccination -     Flu vaccine trivalent PF, 6mos and older(Flulaval,Afluria,Fluarix,Fluzone)    Return in about 1 year (around 09/10/2024) for annual physical, fasting blood work 1 week before.     Melida Quitter, PA

## 2023-09-11 NOTE — Patient Instructions (Signed)
You are completely up to date on your preventative care. Great job!!!  Things to do to keep yourself healthy! - Exercise at least 30-45 minutes a day, 3-4 days a week.  - Eat a low-fat diet with lots of fruits and vegetables, up to 7-9 servings per day.  - Seatbelts can save your life. Wear them always.  - Smoke detectors on every level of your home, check batteries every year.  - Eye Doctor: have an eye exam every 1-2 years, even if you do not wear glasses or contacts. - Safe sex: if you may be exposed to STDs, use a condom.  - Alcohol: If you drink, do it moderately, less than 2 drinks per day.  - Health Care Power of Attorney: choose someone to speak for you if you are not able.  - Depression and anxiety are common in our stressful world. If you're feeling stressed, down, or like you're losing interest in things you normally enjoy, please come in for a visit.  - Violence: If anyone is threatening or hurting you, please call immediately.  Everyone deserves to be safe and loved in all of their relationships.

## 2023-09-12 ENCOUNTER — Encounter: Payer: 59 | Admitting: Family Medicine

## 2023-10-11 ENCOUNTER — Other Ambulatory Visit: Payer: Self-pay | Admitting: Family Medicine

## 2023-10-11 ENCOUNTER — Ambulatory Visit (INDEPENDENT_AMBULATORY_CARE_PROVIDER_SITE_OTHER): Payer: 59 | Admitting: Family Medicine

## 2023-10-11 DIAGNOSIS — R82998 Other abnormal findings in urine: Secondary | ICD-10-CM

## 2023-10-11 DIAGNOSIS — R319 Hematuria, unspecified: Secondary | ICD-10-CM

## 2023-10-11 DIAGNOSIS — R300 Dysuria: Secondary | ICD-10-CM

## 2023-10-11 LAB — POCT URINALYSIS DIP (CLINITEK)
Bilirubin, UA: NEGATIVE
Glucose, UA: NEGATIVE mg/dL
Ketones, POC UA: NEGATIVE mg/dL
Nitrite, UA: POSITIVE — AB
POC PROTEIN,UA: 100 — AB
Spec Grav, UA: >=1.03 — AB (ref 1.010–1.025)
Urobilinogen, UA: 0.2 U/dL
pH, UA: 6.5 (ref 5.0–8.0)

## 2023-10-11 MED ORDER — NITROFURANTOIN MONOHYD MACRO 100 MG PO CAPS: 100.0000 mg | ORAL_CAPSULE | Freq: Two times a day (BID) | ORAL | 0 refills | Status: DC

## 2023-10-11 NOTE — Addendum Note (Signed)
Addended by: Tonny Bollman on: 10/11/2023 10:32 AM   Modules accepted: Orders

## 2023-10-11 NOTE — Progress Notes (Signed)
Urine dipstick shows positive for RBC's, positive for protein, positive for nitrates, and positive for leukocytes.

## 2023-10-16 LAB — URINE CULTURE

## 2023-11-17 ENCOUNTER — Ambulatory Visit
Admission: RE | Admit: 2023-11-17 | Discharge: 2023-11-17 | Disposition: A | Discharge status: Home / Self Care | Payer: 59 | Source: Ambulatory Visit | Attending: Obstetrics and Gynecology | Admitting: Obstetrics and Gynecology

## 2023-11-17 DIAGNOSIS — R921 Mammographic calcification found on diagnostic imaging of breast: Secondary | ICD-10-CM

## 2023-11-30 ENCOUNTER — Encounter: Payer: Self-pay | Admitting: Family Medicine

## 2024-03-12 ENCOUNTER — Other Ambulatory Visit: Payer: Self-pay | Admitting: Physical Medicine and Rehabilitation

## 2024-03-12 ENCOUNTER — Ambulatory Visit: Payer: Self-pay

## 2024-03-12 ENCOUNTER — Telehealth: Payer: Self-pay

## 2024-03-12 MED ORDER — NITROFURANTOIN MONOHYD MACRO 100 MG PO CAPS: 100.0000 mg | ORAL_CAPSULE | Freq: Two times a day (BID) | ORAL | 0 refills | Status: DC

## 2024-03-12 NOTE — Telephone Encounter (Signed)
  Chief Complaint: UTI Symptoms: frequency; smell   Disposition: [] ED /[x] Urgent Care (no appt availability in office) / [] Appointment(In office/virtual)/ []  [facility]/ [] Home Care/ [] Refused Recommended Disposition /[] [facility]/ []  Follow-up with PCP Additional Notes: Pt calling with concerns of UTI. Pt has increase in frequency and noticed foul smell. Symptoms started this morning. Pt denies fever, pain, an burning. No office appts until July. RN advised pt to go to UC. Pt stated she will try her OB and if she can't get in she will go to UC. Pt stated she will call back to set up new PCP appt. RN gave care advice and pt verbalized understanding.             Copied from CRM [id number]. Topic: General - Other >> Mar 12, 2024  8:15 AM [name] wrote: Reason for CRM: patient is requesting medication for a uti she would like medication called in for it instead of coming in she is certain she has a uti Reason for Disposition  Urinating more frequently than usual (i.e., frequency)  Answer Assessment - Initial Assessment Questions 1. SYMPTOM: "What's the main symptom you're concerned about?" (e.g., frequency, incontinence)     Urgency  2. ONSET: "When did the    start?"     This morning at 3am  3. PAIN: "Is there any pain?" If Yes, ask: "How bad is it?" (Scale: 1-10; mild, moderate, severe)     Denies  4. CAUSE: "What do you think is causing the symptoms?"     Bubble baths, intercourse  5. OTHER SYMPTOMS: "Do you have any other symptoms?" (e.g., blood in urine, fever, flank pain, pain with urination)     Foul smell  Protocols used: Urinary Symptoms-A-AH

## 2024-03-12 NOTE — Telephone Encounter (Signed)
 She doesn't have to come in for an appt but she needs to give a urine sample before we can prescribe abx.

## 2024-03-12 NOTE — Telephone Encounter (Signed)
 Copied from CRM [id number]. Topic: General - Other >> Mar 12, 2024  8:12 AM [name] wrote: Reason for CRM: patient is requesting medication for a uti she would like to know if something can be called in instead of her having to come in    Called pt LVM to contact the office

## 2024-08-22 ENCOUNTER — Telehealth: Payer: Self-pay | Admitting: *Deleted

## 2024-08-22 NOTE — Telephone Encounter (Signed)
 LVM for pt to call to reschedule the appt that is scheduled on 11/19. Will also send a mychart message

## 2024-08-26 ENCOUNTER — Encounter: Payer: Self-pay | Admitting: Radiology

## 2024-08-26 NOTE — Telephone Encounter (Signed)
 LVM informing pt that we have cancelled her appt on 11/19 due to the provider being out of the office.  Please assist in getting this rescheduled if she calls back.

## 2024-09-02 ENCOUNTER — Other Ambulatory Visit: Payer: Self-pay

## 2024-09-02 DIAGNOSIS — Z1159 Encounter for screening for other viral diseases: Secondary | ICD-10-CM

## 2024-09-02 DIAGNOSIS — Z130 Encounter for screening for diseases of the blood and blood-forming organs and certain disorders involving the immune mechanism: Secondary | ICD-10-CM

## 2024-09-04 ENCOUNTER — Ambulatory Visit (INDEPENDENT_AMBULATORY_CARE_PROVIDER_SITE_OTHER)

## 2024-09-04 ENCOUNTER — Other Ambulatory Visit: Payer: 59

## 2024-09-04 DIAGNOSIS — Z23 Encounter for immunization: Secondary | ICD-10-CM

## 2024-09-04 DIAGNOSIS — Z130 Encounter for screening for diseases of the blood and blood-forming organs and certain disorders involving the immune mechanism: Secondary | ICD-10-CM

## 2024-09-04 DIAGNOSIS — Z1159 Encounter for screening for other viral diseases: Secondary | ICD-10-CM

## 2024-09-05 LAB — COMPREHENSIVE METABOLIC PANEL WITH GFR
ALT: 10 IU/L (ref 0–32)
AST: 17 IU/L (ref 0–40)
Albumin: 4.7 g/dL (ref 3.9–4.9)
Alkaline Phosphatase: 70 IU/L (ref 41–116)
BUN/Creatinine Ratio: 22 (ref 9–23)
BUN: 18 mg/dL (ref 6–20)
Bilirubin Total: 0.5 mg/dL (ref 0.0–1.2)
CO2: 19 mmol/L — ABNORMAL LOW (ref 20–29)
Calcium: 9.5 mg/dL (ref 8.7–10.2)
Chloride: 102 mmol/L (ref 96–106)
Creatinine, Ser: 0.83 mg/dL (ref 0.57–1.00)
Globulin, Total: 2.5 g/dL (ref 1.5–4.5)
Glucose: 75 mg/dL (ref 70–99)
Potassium: 4.5 mmol/L (ref 3.5–5.2)
Sodium: 136 mmol/L (ref 134–144)
Total Protein: 7.2 g/dL (ref 6.0–8.5)
eGFR: 94 mL/min/1.73 (ref >59)

## 2024-09-05 LAB — CBC WITH DIFFERENTIAL/PLATELET
Basophils Absolute: 0.1 x10E3/uL (ref 0.0–0.2)
Basos: 2 %
EOS (ABSOLUTE): 0.4 x10E3/uL (ref 0.0–0.4)
Eos: 6 %
Hematocrit: 40.6 % (ref 34.0–46.6)
Hemoglobin: 13.6 g/dL (ref 11.1–15.9)
Immature Grans (Abs): 0 x10E3/uL (ref 0.0–0.1)
Immature Granulocytes: 0 %
Lymphocytes Absolute: 1.9 x10E3/uL (ref 0.7–3.1)
Lymphs: 28 %
MCH: 30.8 pg (ref 26.6–33.0)
MCHC: 33.5 g/dL (ref 31.5–35.7)
MCV: 92 fL (ref 79–97)
Monocytes Absolute: 0.6 x10E3/uL (ref 0.1–0.9)
Monocytes: 9 %
Neutrophils Absolute: 3.6 x10E3/uL (ref 1.4–7.0)
Neutrophils: 55 %
Platelets: 313 x10E3/uL (ref 150–450)
RBC: 4.42 x10E6/uL (ref 3.77–5.28)
RDW: 11.6 % — ABNORMAL LOW (ref 11.7–15.4)
WBC: 6.5 x10E3/uL (ref 3.4–10.8)

## 2024-09-05 LAB — LIPID PANEL
Chol/HDL Ratio: 2.5 ratio (ref 0.0–4.4)
Cholesterol, Total: 154 mg/dL (ref 100–199)
HDL: 62 mg/dL (ref >39)
LDL Chol Calc (NIH): 81 mg/dL (ref 0–99)
Triglycerides: 55 mg/dL (ref 0–149)
VLDL Cholesterol Cal: 11 mg/dL (ref 5–40)

## 2024-09-05 LAB — HEPATITIS C ANTIBODY: Hep C Virus Ab: NONREACTIVE

## 2024-09-05 LAB — VITAMIN D 25 HYDROXY (VIT D DEFICIENCY, FRACTURES): Vit D, 25-Hydroxy: 33 ng/mL (ref 30.0–100.0)

## 2024-09-05 LAB — HEMOGLOBIN A1C
Est. average glucose Bld gHb Est-mCnc: 94 mg/dL
Hgb A1c MFr Bld: 4.9 % (ref 4.8–5.6)

## 2024-09-05 LAB — TSH: TSH: 1.13 u[IU]/mL (ref 0.450–4.500)

## 2024-09-06 ENCOUNTER — Ambulatory Visit: Payer: Self-pay | Admitting: Family Medicine

## 2024-09-11 ENCOUNTER — Encounter: Payer: 59 | Admitting: Family Medicine

## 2024-09-11 ENCOUNTER — Encounter

## 2024-09-24 ENCOUNTER — Encounter

## 2024-11-01 ENCOUNTER — Other Ambulatory Visit: Payer: Self-pay | Admitting: Obstetrics and Gynecology

## 2024-11-01 DIAGNOSIS — R921 Mammographic calcification found on diagnostic imaging of breast: Secondary | ICD-10-CM

## 2024-11-06 ENCOUNTER — Ambulatory Visit (INDEPENDENT_AMBULATORY_CARE_PROVIDER_SITE_OTHER)

## 2024-11-06 VITALS — BP 115/77 | HR 69 | Temp 98.1°F | Ht 61.0 in | Wt 116.0 lb

## 2024-11-06 DIAGNOSIS — G43909 Migraine, unspecified, not intractable, without status migrainosus: Secondary | ICD-10-CM | POA: Insufficient documentation

## 2024-11-06 DIAGNOSIS — Z0000 Encounter for general adult medical examination without abnormal findings: Secondary | ICD-10-CM | POA: Diagnosis not present

## 2024-11-06 DIAGNOSIS — J4520 Mild intermittent asthma, uncomplicated: Secondary | ICD-10-CM | POA: Diagnosis not present

## 2024-11-06 MED ORDER — SUMATRIPTAN SUCCINATE 50 MG PO TABS: 50.0000 mg | ORAL_TABLET | ORAL | 0 refills | Status: AC | PRN

## 2024-11-06 NOTE — Assessment & Plan Note (Signed)
 Routine wellness visit with normal labs. RDW slightly low, Vitamin D  low normal. No anemia or metabolic fatigue causes. - Recommended vitamin D  supplement, 2000 units daily or 2-3 times a week. - Consider magnesium supplement at bedtime for sleep quality. - Continue routine health maintenance, including mammograms due to family history.

## 2024-11-06 NOTE — Progress Notes (Signed)
 "  Complete physical exam  Patient: Meagan Torres   DOB: 1985/11/13   39 y.o. Female  MRN: [id number]  Subjective:    Chief Complaint  Patient presents with   Annual Exam    Physical     History of Present Illness   Meagan Torres is a 39 year old female who presents for CPE. Notes that she is overall feeling well with some days better than others. She sleeps well. Staying active and eating well balanced, no special diets. Periods are occurring once per month. 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 Also working toward her [employer] with hopes of becoming a reading specialist.   Fatigue and dizziness - Fatigue with intermittent episodes throughout the day - Occasional dizziness - Episodes characterized by feeling off and perceived need to hydrate or consume protein - No major issues with sleep - Vitamin D  levels at the lower end of normal  Menstrual-associated migraines - Migraines occurring around the menstrual cycle, specifically right before or during the first couple of days - Previously experienced migraines once every few years, but in the past year has had three to four episodes  - Thinks they are getting to the point where she is interested in trying migraine abortive therapy. Has never been on any medications for migraines before.          Most recent fall risk assessment:    11/06/2024    9:13 AM  Fall Risk   Falls in the past year? 0  Injury with Fall? 0  Risk for fall due to : No Fall Risks  Follow up Falls evaluation completed     Most recent depression screenings:    11/06/2024    9:13 AM 09/11/2023    9:03 AM  PHQ 2/9 Scores  PHQ - 2 Score 0 0  PHQ- 9 Score 1 1      Data saved with a previous flowsheet row definition    Vision:Within last year and Dental: No current dental problems and Receives regular dental care    Patient Care Team: Gayle Saddie JULIANNA DEVONNA as PCP - General (Physician Assistant) Dermatology,  [facility], [place]   Show/hide medication list[1]  ROS  Per HPI       Objective:     BP 115/77   Pulse 69   Temp 98.1 F (36.7 C) (Oral)   Ht 5' 1 (1.549 m)   Wt 116 lb (52.6 kg)   LMP 10/14/2024   SpO2 100%   BMI 21.92 kg/m    Physical Exam Constitutional:      General: She is not in acute distress.    Appearance: Normal appearance.  Cardiovascular:     Rate and Rhythm: Normal rate and regular rhythm.     Heart sounds: Normal heart sounds. No murmur heard.    No friction rub. No gallop.  Pulmonary:     Effort: Pulmonary effort is normal. No respiratory distress.     Breath sounds: Normal breath sounds.  Abdominal:     General: Bowel sounds are normal.  Musculoskeletal:        General: No swelling.     Cervical back: Neck supple.  Lymphadenopathy:     Cervical: No cervical adenopathy.  Skin:    General: Skin is warm and dry.  Neurological:     General: No focal deficit present.     Mental Status: She is alert.  Psychiatric:        Mood and  Affect: Mood normal.        Behavior: Behavior normal.        Thought Content: Thought content normal.       No results found for any visits on 11/06/24. Last CBC Lab Results  Component Value Date   WBC 6.5 09/04/2024   HGB 13.6 09/04/2024   HCT 40.6 09/04/2024   MCV 92 09/04/2024   MCH 30.8 09/04/2024   RDW 11.6 (L) 09/04/2024   PLT 313 09/04/2024   Last metabolic panel Lab Results  Component Value Date   GLUCOSE 75 09/04/2024   NA 136 09/04/2024   K 4.5 09/04/2024   CL 102 09/04/2024   CO2 19 (L) 09/04/2024   BUN 18 09/04/2024   CREATININE 0.83 09/04/2024   EGFR 94 09/04/2024   CALCIUM 9.5 09/04/2024   PROT 7.2 09/04/2024   ALBUMIN 4.7 09/04/2024   LABGLOB 2.5 09/04/2024   AGRATIO 1.7 09/21/2022   BILITOT 0.5 09/04/2024   ALKPHOS 70 09/04/2024   AST 17 09/04/2024   ALT 10 09/04/2024   ANIONGAP 6 12/20/2014   Last lipids Lab Results  Component Value Date   CHOL 154 09/04/2024    HDL 62 09/04/2024   LDLCALC 81 09/04/2024   TRIG 55 09/04/2024   CHOLHDL 2.5 09/04/2024   Last hemoglobin A1c Lab Results  Component Value Date   HGBA1C 4.9 09/04/2024   Last thyroid functions Lab Results  Component Value Date   TSH 1.130 09/04/2024   Last vitamin D  Lab Results  Component Value Date   VD25OH 33.0 09/04/2024        Assessment & Plan:    Routine Health Maintenance and Physical Exam  Health Maintenance  Topic Date Due   Hepatitis B Vaccine (1 of 3 - 19+ 3-dose series) Never done   HPV Vaccine (1 - Risk 3-dose SCDM series) Never done   COVID-19 Vaccine (4 - 2025-26 season) 11/22/2024*   Pap with HPV screening  11/09/2027   DTaP/Tdap/Td vaccine (2 - Td or Tdap) 04/21/2029   Flu Shot  Completed   Hepatitis C Screening  Completed   HIV Screening  Completed   Pneumococcal Vaccine  Aged Out   Meningitis B Vaccine  Aged Out  *Topic was postponed. The date shown is not the original due date.    Discussed health benefits of physical activity, and encouraged her to engage in regular exercise appropriate for her age and condition.  Acute migraine Assessment & Plan: Migraines 3-4 times yearly, around menstruation. Preventative treatment not indicated due to seldom frequency. Migraines do interfere with her daily life.  Discussed acute treatment options, sumatriptan  first-line due to insurance. - Prescribed sumatriptan  50 mg for acute migraine treatment. - Instructed to take sumatriptan  at onset, repeat after 2 hours if needed, max two doses in 24 hours. - Advised to monitor for drowsiness. - Instructed to contact via MyChart if sumatriptan  ineffective or intolerable for alternatives. - Pt would be good candidate for Nurtec if Imitrex  fails    Mild intermittent asthma, unspecified whether complicated Assessment & Plan: Managed with albuterol  inhaler as needed. - Continue current use of albuterol  inhaler as needed.       Wellness examination Assessment &  Plan: Routine wellness visit with normal labs. RDW slightly low, Vitamin D  low normal. No anemia or metabolic fatigue causes. - Recommended vitamin D  supplement, 2000 units daily or 2-3 times a week. - Consider magnesium supplement at bedtime for sleep quality. - Continue routine health maintenance, including mammograms  due to family history.   Other orders -     SUMAtriptan  Succinate; Take 1 tablet (50 mg total) by mouth every 2 (two) hours as needed for migraine. May repeat in 2 hours if headache persists or recurs.  Dispense: 10 tablet; Refill: 0    Return in about 1 year (around 11/06/2025) for Physical.     Saddie JULIANNA Sacks, PA-C     [1]  Outpatient Medications Prior to Visit  Medication Sig   albuterol  (PROAIR  HFA) 108 (90 Base) MCG/ACT inhaler Inhale 1-2 puffs into the lungs every 6 (six) hours as needed.   [DISCONTINUED] nitrofurantoin , macrocrystal-monohydrate, (MACROBID ) 100 MG capsule Take 1 capsule (100 mg total) by mouth 2 (two) times daily.   No facility-administered medications prior to visit.   "

## 2024-11-06 NOTE — Assessment & Plan Note (Signed)
 Migraines 3-4 times yearly, around menstruation. Preventative treatment not indicated due to seldom frequency. Migraines do interfere with her daily life.  Discussed acute treatment options, sumatriptan  first-line due to insurance. - Prescribed sumatriptan  50 mg for acute migraine treatment. - Instructed to take sumatriptan  at onset, repeat after 2 hours if needed, max two doses in 24 hours. - Advised to monitor for drowsiness. - Instructed to contact via MyChart if sumatriptan  ineffective or intolerable for alternatives. - Pt would be good candidate for Nurtec if Imitrex  fails

## 2024-11-06 NOTE — Patient Instructions (Signed)
 VISIT SUMMARY: Today, we addressed your fatigue, dizziness, menstrual irregularities, and migraines. We also discussed your routine wellness visit and asthma management.  YOUR PLAN: FATIGUE AND DIZZINESS: You have been experiencing fatigue with occasional dizziness and low normal Vitamin D  levels. -Take a vitamin D  supplement, 2000 units daily or 2-3 times a week. -Consider taking a magnesium glycinate supplement at bedtime to improve sleep quality.  MENSTRUAL-RELATED MIGRAINE: You have been experiencing migraines around your menstrual cycle, 3-4 times a year. -Take sumatriptan  at the onset of a migraine. If needed, you can take a second dose after 2 hours, but do not exceed two doses in 24 hours. -Monitor for drowsiness as a side effect. -Contact us  via MyChart if sumatriptan  is ineffective or if you experience any intolerable side effects.  ASTHMA: Your asthma is managed with an albuterol  inhaler as needed. -Continue using your albuterol  inhaler as needed.  GENERAL HEALTH MAINTENANCE: Routine wellness visit with normal labs. Vitamin D  low normal. No anemia or metabolic fatigue causes. -Continue routine health maintenance, including mammograms due to family history.  If you have any problems before your next visit feel free to message me via MyChart (minor issues or questions) or call the office, otherwise you may reach out to schedule an office visit.  Thank you! Saddie Sacks, PA-C

## 2024-11-06 NOTE — Assessment & Plan Note (Signed)
 Managed with albuterol  inhaler as needed. - Continue current use of albuterol  inhaler as needed.

## 2024-11-19 ENCOUNTER — Encounter

## 2024-12-03 ENCOUNTER — Encounter

## 2025-10-31 ENCOUNTER — Other Ambulatory Visit

## 2025-11-07 ENCOUNTER — Encounter
